# Patient Record
Sex: Male | Born: 1965 | Race: White | Hispanic: No | Marital: Married | Smoking: Never smoker
Health system: Southern US, Community
[De-identification: ages and names within clinical notes are randomized; demographics above are authoritative.]

## PROBLEM LIST (undated history)

## (undated) DIAGNOSIS — E7800 Pure hypercholesterolemia, unspecified: Secondary | ICD-10-CM

## (undated) HISTORY — PX: WISDOM TOOTH EXTRACTION: SHX21

## (undated) HISTORY — DX: Pure hypercholesterolemia, unspecified: E78.00

---

## 2017-07-07 DIAGNOSIS — E7800 Pure hypercholesterolemia, unspecified: Secondary | ICD-10-CM | POA: Insufficient documentation

## 2017-07-07 DIAGNOSIS — F439 Reaction to severe stress, unspecified: Secondary | ICD-10-CM | POA: Insufficient documentation

## 2017-07-07 DIAGNOSIS — K219 Gastro-esophageal reflux disease without esophagitis: Secondary | ICD-10-CM | POA: Insufficient documentation

## 2020-10-12 DIAGNOSIS — Z03818 Encounter for observation for suspected exposure to other biological agents ruled out: Secondary | ICD-10-CM | POA: Diagnosis not present

## 2020-10-12 DIAGNOSIS — Z20822 Contact with and (suspected) exposure to covid-19: Secondary | ICD-10-CM | POA: Diagnosis not present

## 2021-06-03 ENCOUNTER — Other Ambulatory Visit: Payer: Self-pay

## 2021-06-03 ENCOUNTER — Other Ambulatory Visit: Payer: Self-pay | Admitting: Family Medicine

## 2021-06-03 ENCOUNTER — Ambulatory Visit (INDEPENDENT_AMBULATORY_CARE_PROVIDER_SITE_OTHER): Payer: No Typology Code available for payment source | Admitting: Family Medicine

## 2021-06-03 ENCOUNTER — Encounter: Payer: Self-pay | Admitting: Family Medicine

## 2021-06-03 DIAGNOSIS — E7800 Pure hypercholesterolemia, unspecified: Secondary | ICD-10-CM | POA: Diagnosis not present

## 2021-06-03 DIAGNOSIS — K219 Gastro-esophageal reflux disease without esophagitis: Secondary | ICD-10-CM | POA: Diagnosis not present

## 2021-06-03 MED ORDER — OMEPRAZOLE 20 MG PO CPDR: 20.0000 mg | DELAYED_RELEASE_CAPSULE | Freq: Every day | ORAL | 3 refills | Status: DC

## 2021-06-03 NOTE — Progress Notes (Signed)
Cody Griffin - 55 y.o. male MRN [id number]  Date of birth: [birth year]  Subjective No chief complaint on file.   HPI Cody Griffin is a 55 year old male here today for initial visit to establish care.  He reports he has been in good health.  He does have a history of hyperlipidemia and was treated with pravastatin at one point time.  He is taking Prilosec daily for management of acid reflux.  He would like a prescription for this.  He will plan to schedule a physical to get caught up on labs as well as health maintenance items.  ROS:  A comprehensive ROS was completed and negative except as noted per HPI  Allergies  Allergen Reactions   Penicillins Hives    Past Medical History:  Diagnosis Date   Hypercholesterolemia     Past Surgical History:  Procedure Laterality Date   WISDOM TOOTH EXTRACTION      Social History   Socioeconomic History   Marital status: Married    Spouse name: Not on file   Number of children: Not on file   Years of education: Not on file   Highest education level: Not on file  Occupational History   Not on file  Tobacco Use   Smoking status: Never    Passive exposure: Never   Smokeless tobacco: Never  Vaping Use   Vaping Use: Not on file  Substance and Sexual Activity   Alcohol use: Never   Drug use: Never   Sexual activity: Yes    Partners: Female  Other Topics Concern   Not on file  Social History Narrative   Not on file   Social Determinants of Health   Financial Resource Strain: Not on file  Food Insecurity: Not on file  Transportation Needs: Not on file  Physical Activity: Not on file  Stress: Not on file  Social Connections: Not on file    Family History  Problem Relation Age of Onset   Diabetes Mother    Melanoma Father     Health Maintenance  Topic Date Due   COVID-19 Vaccine (1) Never done   HIV Screening  Never done   Hepatitis C Screening  Never done   COLONOSCOPY (Pts 45-4yrs Insurance coverage will need to be  confirmed)  Never done   Zoster Vaccines- Shingrix (1 of 2) Never done   INFLUENZA VACCINE  05/04/2021   TETANUS/TDAP  07/08/2027   Pneumococcal Vaccine 80-20 Years old  Aged Out   HPV VACCINES  Aged Out     ----------------------------------------------------------------------------------------------------------------------------------------------------------------------------------------------------------------- Physical Exam BP 129/84   Pulse 73   Temp 97.9 F (36.6 C)   Ht 6' 1.62" (1.87 m)   Wt (!) 305 lb 6.4 oz (138.5 kg)   SpO2 95%   BMI 39.61 kg/m   Physical Exam Constitutional:      Appearance: Normal appearance.  HENT:     Head: Normocephalic and atraumatic.  Eyes:     General: No scleral icterus. Musculoskeletal:     Cervical back: Neck supple.  Neurological:     General: No focal deficit present.     Mental Status: He is alert.  Psychiatric:        Mood and Affect: Mood normal.        Behavior: Behavior normal.    ------------------------------------------------------------------------------------------------------------------------------------------------------------------------------------------------------------------- Assessment and Plan  Gastroesophageal reflux disease without esophagitis Prescription for Prilosec renewed.  This is working well for him at current strength.  Hypercholesteremia We will plan to update lipid panel at upcoming  annual exam.   Meds ordered this encounter  Medications   omeprazole (PRILOSEC) 20 MG capsule    Sig: Take 1 capsule (20 mg total) by mouth daily.    Dispense:  30 capsule    Refill:  3    No follow-ups on file.    This visit occurred during the SARS-CoV-2 public health emergency.  Safety protocols were in place, including screening questions prior to the visit, additional usage of staff PPE, and extensive cleaning of exam room while observing appropriate contact time as indicated for disinfecting  solutions.

## 2021-06-03 NOTE — Patient Instructions (Signed)
Nice to meet you today! Schedule appointment for annual exam.  We'll get updated labs at that visit.

## 2021-06-03 NOTE — Assessment & Plan Note (Signed)
We will plan to update lipid panel at upcoming annual exam.

## 2021-06-03 NOTE — Assessment & Plan Note (Signed)
Prescription for Prilosec renewed.  This is working well for him at current strength.

## 2021-07-07 ENCOUNTER — Encounter: Payer: Self-pay | Admitting: Family Medicine

## 2021-07-07 ENCOUNTER — Other Ambulatory Visit: Payer: Self-pay

## 2021-07-07 ENCOUNTER — Ambulatory Visit (INDEPENDENT_AMBULATORY_CARE_PROVIDER_SITE_OTHER): Payer: No Typology Code available for payment source | Admitting: Family Medicine

## 2021-07-07 VITALS — BP 149/81 | HR 70 | Temp 97.6°F | Ht 73.0 in | Wt 306.0 lb

## 2021-07-07 DIAGNOSIS — Z125 Encounter for screening for malignant neoplasm of prostate: Secondary | ICD-10-CM

## 2021-07-07 DIAGNOSIS — Z23 Encounter for immunization: Secondary | ICD-10-CM | POA: Diagnosis not present

## 2021-07-07 DIAGNOSIS — Z0000 Encounter for general adult medical examination without abnormal findings: Secondary | ICD-10-CM

## 2021-07-07 DIAGNOSIS — Z1211 Encounter for screening for malignant neoplasm of colon: Secondary | ICD-10-CM

## 2021-07-07 DIAGNOSIS — E7800 Pure hypercholesterolemia, unspecified: Secondary | ICD-10-CM

## 2021-07-07 DIAGNOSIS — Z1159 Encounter for screening for other viral diseases: Secondary | ICD-10-CM

## 2021-07-07 DIAGNOSIS — L309 Dermatitis, unspecified: Secondary | ICD-10-CM

## 2021-07-07 DIAGNOSIS — R7309 Other abnormal glucose: Secondary | ICD-10-CM | POA: Diagnosis not present

## 2021-07-07 MED ORDER — CLOBETASOL PROPIONATE 0.05 % EX CREA: 1.0000 "application " | TOPICAL_CREAM | Freq: Two times a day (BID) | CUTANEOUS | 1 refills | Status: DC

## 2021-07-07 NOTE — Assessment & Plan Note (Signed)
Start clobetasol cream twice daily as needed.

## 2021-07-07 NOTE — Assessment & Plan Note (Signed)
Well adult Orders Placed This Encounter  Procedures  . Varicella-zoster vaccine IM (Shingrix)  . Flu Vaccine QUAD 6+ mos PF IM (Fluarix Quad PF)  . Cologuard  . COMPLETE METABOLIC PANEL WITH GFR  . CBC with Differential  . Lipid Panel w/reflex Direct LDL  . PSA  . Hepatitis C Antibody  Screenings: Hep C antibody, Cologuard and lipid panel. Immunizations: Shingrix and flu vaccine given today. Anticipatory guidance/risk factor reduction: I encouraged him to continue to try to stay active and work on dietary change for weight management.  Additional recommendations per AVS.

## 2021-07-07 NOTE — Progress Notes (Signed)
Cody Griffin - 55 y.o. male MRN [id number]  Date of birth: 27-Aug-1966  Subjective No chief complaint on file.   HPI Cody Griffin is a 55 year old male here today for annual exam.  He states overall he is doing well.  He does have a rash on bilateral palms.  This is recurrent and he does have some cracking and scaling of skin.  He does use a moisturizer on his hands daily.  He would like to have shingles and flu vaccine today. He reports that he tries to stay fairly active and walks frequently.  Is felt his diet could be improved.  He does tend to eat out a bit more due to his kids traveling sports teams.  He is a non-smoker.  He denies alcohol use.  He has never had colon cancer screening and would prefer to have Cologuard.  Review of Systems  Constitutional:  Negative for chills, fever, malaise/fatigue and weight loss.  HENT:  Negative for congestion, ear pain and sore throat.   Eyes:  Negative for blurred vision, double vision and pain.  Respiratory:  Negative for cough and shortness of breath.   Cardiovascular:  Negative for chest pain and palpitations.  Gastrointestinal:  Negative for abdominal pain, blood in stool, constipation, heartburn and nausea.  Genitourinary:  Negative for dysuria and urgency.  Musculoskeletal:  Negative for joint pain and myalgias.  Neurological:  Negative for dizziness and headaches.  Endo/Heme/Allergies:  Does not bruise/bleed easily.  Psychiatric/Behavioral:  Negative for depression. The patient is not nervous/anxious and does not have insomnia.      Allergies  Allergen Reactions   Penicillins Hives    Past Medical History:  Diagnosis Date   Hypercholesterolemia     Past Surgical History:  Procedure Laterality Date   WISDOM TOOTH EXTRACTION      Social History   Socioeconomic History   Marital status: Married    Spouse name: Not on file   Number of children: Not on file   Years of education: Not on file   Highest education level: Not  on file  Occupational History   Not on file  Tobacco Use   Smoking status: Never    Passive exposure: Never   Smokeless tobacco: Never  Vaping Use   Vaping Use: Not on file  Substance and Sexual Activity   Alcohol use: Never   Drug use: Never   Sexual activity: Yes    Partners: Female  Other Topics Concern   Not on file  Social History Narrative   Not on file   Social Determinants of Health   Financial Resource Strain: Not on file  Food Insecurity: Not on file  Transportation Needs: Not on file  Physical Activity: Not on file  Stress: Not on file  Social Connections: Not on file    Family History  Problem Relation Age of Onset   Diabetes Mother    Melanoma Father     Health Maintenance  Topic Date Due   COVID-19 Vaccine (1) Never done   HIV Screening  Never done   Hepatitis C Screening  Never done   COLONOSCOPY (Pts 45-4yrs Insurance coverage will need to be confirmed)  Never done   Zoster Vaccines- Shingrix (2 of 2) 09/01/2021   TETANUS/TDAP  07/08/2027   INFLUENZA VACCINE  Completed   HPV VACCINES  Aged Out     ----------------------------------------------------------------------------------------------------------------------------------------------------------------------------------------------------------------- Physical Exam BP (!) 149/81 (BP Location: Left Arm, Patient Position: Sitting, Cuff Size: Large)   Pulse 70   Temp  97.6 F (36.4 C)   Ht 6\' 1"  (1.854 m)   Wt (!) 306 lb (138.8 kg)   SpO2 97%   BMI 40.37 kg/m   Physical Exam Constitutional:      General: He is not in acute distress. HENT:     Head: Normocephalic and atraumatic.     Right Ear: Tympanic membrane and external ear normal.     Left Ear: Tympanic membrane and external ear normal.  Eyes:     General: No scleral icterus. Neck:     Thyroid: No thyromegaly.  Cardiovascular:     Rate and Rhythm: Normal rate and regular rhythm.     Heart sounds: Normal heart sounds.   Pulmonary:     Effort: Pulmonary effort is normal.     Breath sounds: Normal breath sounds.  Abdominal:     General: Bowel sounds are normal. There is no distension.     Palpations: Abdomen is soft.     Tenderness: There is no abdominal tenderness. There is no guarding.  Musculoskeletal:     Cervical back: Normal range of motion.  Lymphadenopathy:     Cervical: No cervical adenopathy.  Skin:    General: Skin is warm and dry.     Findings: No rash.  Neurological:     Mental Status: He is alert and oriented to person, place, and time.     Cranial Nerves: No cranial nerve deficit.     Motor: No abnormal muscle tone.  Psychiatric:        Mood and Affect: Mood normal.        Behavior: Behavior normal.    ------------------------------------------------------------------------------------------------------------------------------------------------------------------------------------------------------------------- Assessment and Plan  Well adult exam Well adult Orders Placed This Encounter  Procedures   Varicella-zoster vaccine IM (Shingrix)   Flu Vaccine QUAD 6+ mos PF IM (Fluarix Quad PF)   Cologuard   COMPLETE METABOLIC PANEL WITH GFR   CBC with Differential   Lipid Panel w/reflex Direct LDL   PSA   Hepatitis C Antibody  Screenings: Hep C antibody, Cologuard and lipid panel. Immunizations: Shingrix and flu vaccine given today. Anticipatory guidance/risk factor reduction: I encouraged him to continue to try to stay active and work on dietary change for weight management.  Additional recommendations per AVS.  Eczema Start clobetasol cream twice daily as needed.   Meds ordered this encounter  Medications   clobetasol cream (TEMOVATE) 0.05 %    Sig: Apply 1 application topically 2 (two) times daily.    Dispense:  45 g    Refill:  1    No follow-ups on file.    This visit occurred during the SARS-CoV-2 public health emergency.  Safety protocols were in place,  including screening questions prior to the visit, additional usage of staff PPE, and extensive cleaning of exam room while observing appropriate contact time as indicated for disinfecting solutions.

## 2021-07-07 NOTE — Patient Instructions (Signed)
Preventive Care 40-53 Years Old, Male Preventive care refers to lifestyle choices and visits with your health care provider that can promote health and wellness. This includes: A yearly physical exam. This is also called an annual wellness visit. Regular dental and eye exams. Immunizations. Screening for certain conditions. Healthy lifestyle choices, such as: Eating a healthy diet. Getting regular exercise. Not using drugs or products that contain nicotine and tobacco. Limiting alcohol use. What can I expect for my preventive care visit? Physical exam Your health care provider will check your: Height and weight. These may be used to calculate your BMI (body mass index). BMI is a measurement that tells if you are at a healthy weight. Heart rate and blood pressure. Body temperature. Skin for abnormal spots. Counseling Your health care provider may ask you questions about your: Past medical problems. Family's medical history. Alcohol, tobacco, and drug use. Emotional well-being. Home life and relationship well-being. Sexual activity. Diet, exercise, and sleep habits. Work and work environment. Access to firearms. What immunizations do I need? Vaccines are usually given at various ages, according to a schedule. Your health care provider will recommend vaccines for you based on your age, medical history, and lifestyle or other factors, such as travel or where you work. What tests do I need? Blood tests Lipid and cholesterol levels. These may be checked every 5 years, or more often if you are over 50 years old. Hepatitis C test. Hepatitis B test. Screening Lung cancer screening. You may have this screening every year starting at age 55 if you have a 30-pack-year history of smoking and currently smoke or have quit within the past 15 years. Prostate cancer screening. Recommendations will vary depending on your family history and other risks. Genital exam to check for testicular cancer  or hernias. Colorectal cancer screening. All adults should have this screening starting at age 50 and continuing until age 75. Your health care provider may recommend screening at age 45 if you are at increased risk. You will have tests every 1-10 years, depending on your results and the type of screening test. Diabetes screening. This is done by checking your blood sugar (glucose) after you have not eaten for a while (fasting). You may have this done every 1-3 years. STD (sexually transmitted disease) testing, if you are at risk. Follow these instructions at home: Eating and drinking  Eat a diet that includes fresh fruits and vegetables, whole grains, lean protein, and low-fat dairy products. Take vitamin and mineral supplements as recommended by your health care provider. Do not drink alcohol if your health care provider tells you not to drink. If you drink alcohol: Limit how much you have to 0-2 drinks a day. Be aware of how much alcohol is in your drink. In the [place] one drink equals one 12 oz bottle of beer (355 mL), one 5 oz glass of wine (148 mL), or one 1 oz glass of hard liquor (44 mL). Lifestyle Take daily care of your teeth and gums. Brush your teeth every morning and night with fluoride toothpaste. Floss one time each day. Stay active. Exercise for at least 30 minutes 5 or more days each week. Do not use any products that contain nicotine or tobacco, such as cigarettes, e-cigarettes, and chewing tobacco. If you need help quitting, ask your health care provider. Do not use drugs. If you are sexually active, practice safe sex. Use a condom or other form of protection to prevent STIs (sexually transmitted infections). If told by your   health care provider, take low-dose aspirin daily starting at age 50. Find healthy ways to cope with stress, such as: Meditation, yoga, or listening to music. Journaling. Talking to a trusted person. Spending time with friends and  family. Safety Always wear your seat belt while driving or riding in a vehicle. Do not drive: If you have been drinking alcohol. Do not ride with someone who has been drinking. When you are tired or distracted. While texting. Wear a helmet and other protective equipment during sports activities. If you have firearms in your house, make sure you follow all gun safety procedures. What's next? Go to your health care provider once a year for an annual wellness visit. Ask your health care provider how often you should have your eyes and teeth checked. Stay up to date on all vaccines. This information is not intended to replace advice given to you by your health care provider. Make sure you discuss any questions you have with your health care provider. Document Revised: 11/28/2020 Document Reviewed: 09/14/2018 Elsevier Patient Education  2022 Elsevier Inc.   

## 2021-07-10 LAB — CBC WITH DIFFERENTIAL/PLATELET
Absolute Monocytes: 519 cells/uL (ref 200–950)
Basophils Absolute: 59 cells/uL (ref 0–200)
Basophils Relative: 1 %
Eosinophils Absolute: 118 cells/uL (ref 15–500)
Eosinophils Relative: 2 %
HCT: 48.3 % (ref 38.5–50.0)
Hemoglobin: 16.6 g/dL (ref 13.2–17.1)
Lymphs Abs: 2631 cells/uL (ref 850–3900)
MCH: 28.7 pg (ref 27.0–33.0)
MCHC: 34.4 g/dL (ref 32.0–36.0)
MCV: 83.4 fL (ref 80.0–100.0)
MPV: 9.5 fL (ref 7.5–12.5)
Monocytes Relative: 8.8 %
Neutro Abs: 2572 cells/uL (ref 1500–7800)
Neutrophils Relative %: 43.6 %
Platelets: 268 10*3/uL (ref 140–400)
RBC: 5.79 10*6/uL (ref 4.20–5.80)
RDW: 13.4 % (ref 11.0–15.0)
Total Lymphocyte: 44.6 %
WBC: 5.9 10*3/uL (ref 3.8–10.8)

## 2021-07-10 LAB — COMPLETE METABOLIC PANEL WITH GFR
AG Ratio: 1.6 (calc) (ref 1.0–2.5)
ALT: 21 U/L (ref 9–46)
AST: 21 U/L (ref 10–35)
Albumin: 4.4 g/dL (ref 3.6–5.1)
Alkaline phosphatase (APISO): 57 U/L (ref 35–144)
BUN: 14 mg/dL (ref 7–25)
CO2: 24 mmol/L (ref 20–32)
Calcium: 9.2 mg/dL (ref 8.6–10.3)
Chloride: 101 mmol/L (ref 98–110)
Creat: 0.94 mg/dL (ref 0.70–1.30)
Globulin: 2.8 g/dL (calc) (ref 1.9–3.7)
Glucose, Bld: 170 mg/dL — ABNORMAL HIGH (ref 65–99)
Potassium: 4.6 mmol/L (ref 3.5–5.3)
Sodium: 136 mmol/L (ref 135–146)
Total Bilirubin: 0.5 mg/dL (ref 0.2–1.2)
Total Protein: 7.2 g/dL (ref 6.1–8.1)
eGFR: 98 mL/min/{1.73_m2} (ref >=60)

## 2021-07-10 LAB — LIPID PANEL W/REFLEX DIRECT LDL
Cholesterol: 204 mg/dL — ABNORMAL HIGH (ref <200)
HDL: 42 mg/dL (ref >=40)
LDL Cholesterol (Calc): 127 mg/dL (calc) — ABNORMAL HIGH
Non-HDL Cholesterol (Calc): 162 mg/dL (calc) — ABNORMAL HIGH (ref <130)
Total CHOL/HDL Ratio: 4.9 (calc) (ref <5.0)
Triglycerides: 211 mg/dL — ABNORMAL HIGH (ref <150)

## 2021-07-10 LAB — HEMOGLOBIN A1C W/OUT EAG: Hgb A1c MFr Bld: 8.4 % of total Hgb — ABNORMAL HIGH (ref <5.7)

## 2021-07-10 LAB — PSA: PSA: 0.14 ng/mL (ref <=4.00)

## 2021-07-10 LAB — HEPATITIS C ANTIBODY
Hepatitis C Ab: NONREACTIVE
SIGNAL TO CUT-OFF: 0.01 (ref <1.00)

## 2021-07-31 DIAGNOSIS — Z1211 Encounter for screening for malignant neoplasm of colon: Secondary | ICD-10-CM | POA: Diagnosis not present

## 2021-08-07 LAB — COLOGUARD: COLOGUARD: NEGATIVE

## 2021-08-11 ENCOUNTER — Other Ambulatory Visit: Payer: Self-pay

## 2021-08-11 ENCOUNTER — Ambulatory Visit (INDEPENDENT_AMBULATORY_CARE_PROVIDER_SITE_OTHER): Payer: No Typology Code available for payment source

## 2021-08-11 ENCOUNTER — Ambulatory Visit: Payer: No Typology Code available for payment source | Admitting: Sports Medicine

## 2021-08-11 DIAGNOSIS — M5030 Other cervical disc degeneration, unspecified cervical region: Secondary | ICD-10-CM | POA: Insufficient documentation

## 2021-08-11 DIAGNOSIS — M549 Dorsalgia, unspecified: Secondary | ICD-10-CM

## 2021-08-11 MED ORDER — CYCLOBENZAPRINE HCL 10 MG PO TABS: ORAL_TABLET | ORAL | 0 refills | Status: DC

## 2021-08-11 MED ORDER — PREDNISONE 50 MG PO TABS: ORAL_TABLET | ORAL | 0 refills | Status: DC
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
## 2021-08-26 ENCOUNTER — Other Ambulatory Visit: Payer: Self-pay

## 2021-08-26 ENCOUNTER — Ambulatory Visit: Payer: No Typology Code available for payment source | Admitting: Sports Medicine

## 2021-08-26 DIAGNOSIS — M549 Dorsalgia, unspecified: Secondary | ICD-10-CM | POA: Diagnosis not present

## 2021-08-26 MED ORDER — METHOCARBAMOL 750 MG PO TABS: 750.0000 mg | ORAL_TABLET | Freq: Every day | ORAL | 1 refills | Status: DC

## 2021-08-26 MED ORDER — PREDNISONE 10 MG (48) PO TBPK: ORAL_TABLET | Freq: Every day | ORAL | 0 refills | Status: DC
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
## 2021-09-08 ENCOUNTER — Ambulatory Visit: Payer: No Typology Code available for payment source

## 2021-09-17 ENCOUNTER — Ambulatory Visit: Payer: No Typology Code available for payment source | Admitting: Sports Medicine

## 2021-09-17 ENCOUNTER — Other Ambulatory Visit: Payer: Self-pay

## 2021-09-17 DIAGNOSIS — M5030 Other cervical disc degeneration, unspecified cervical region: Secondary | ICD-10-CM | POA: Diagnosis not present

## 2021-09-17 DIAGNOSIS — M5136 Other intervertebral disc degeneration, lumbar region: Secondary | ICD-10-CM

## 2021-09-17 DIAGNOSIS — Z23 Encounter for immunization: Secondary | ICD-10-CM

## 2021-09-17 DIAGNOSIS — M51369 Other intervertebral disc degeneration, lumbar region without mention of lumbar back pain or lower extremity pain: Secondary | ICD-10-CM | POA: Insufficient documentation

## 2021-09-17 MED ORDER — GABAPENTIN 300 MG PO CAPS: ORAL_CAPSULE | ORAL | 3 refills | Status: DC
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
## 2021-09-17 NOTE — Assessment & Plan Note (Signed)
This is a pleasant 55 year old male, he has axial neck pain with radiation into the periscapular region, and weakness in both arms. X-rays did show multilevel degenerative changes, he has been working with a [facility], we have done several courses of steroids, greater than 6 weeks of home physical therapy. Because he continues to have symptoms we are going to proceed now with MRI of the cervical spine, of note he is having progressive weakness in both upper extremities. This will be for epidural planning, return to see me to go over MRI results, significant pain at night as well and will necessitate gabapentin.

## 2021-10-05 ENCOUNTER — Ambulatory Visit (INDEPENDENT_AMBULATORY_CARE_PROVIDER_SITE_OTHER): Payer: No Typology Code available for payment source

## 2021-10-05 ENCOUNTER — Other Ambulatory Visit: Payer: Self-pay

## 2021-10-05 DIAGNOSIS — M5030 Other cervical disc degeneration, unspecified cervical region: Secondary | ICD-10-CM

## 2021-10-05 DIAGNOSIS — M542 Cervicalgia: Secondary | ICD-10-CM | POA: Diagnosis not present

## 2021-10-05 DIAGNOSIS — M5136 Other intervertebral disc degeneration, lumbar region: Secondary | ICD-10-CM | POA: Diagnosis not present

## 2021-10-05 DIAGNOSIS — M51369 Other intervertebral disc degeneration, lumbar region without mention of lumbar back pain or lower extremity pain: Secondary | ICD-10-CM

## 2022-03-08 ENCOUNTER — Telehealth: Payer: Self-pay | Admitting: General Practice

## 2022-03-08 NOTE — Telephone Encounter (Signed)
Transition Care Management Unsuccessful Follow-up Telephone Call  Date of discharge and from where:  03/06/22 from [facility]  Attempts:  1st Attempt  Reason for unsuccessful TCM follow-up call:  Left voice message

## 2022-03-09 DIAGNOSIS — J342 Deviated nasal septum: Secondary | ICD-10-CM | POA: Insufficient documentation

## 2022-03-09 DIAGNOSIS — J328 Other chronic sinusitis: Secondary | ICD-10-CM | POA: Insufficient documentation

## 2022-03-09 DIAGNOSIS — J343 Hypertrophy of nasal turbinates: Secondary | ICD-10-CM | POA: Insufficient documentation

## 2022-03-09 NOTE — Telephone Encounter (Signed)
Transition Care Management Unsuccessful Follow-up Telephone Call  Date of discharge and from where:  03/06/22 from [facility]  Attempts:  2nd Attempt  Reason for unsuccessful TCM follow-up call:  Left voice message

## 2022-03-10 NOTE — Telephone Encounter (Signed)
Transition Care Management Unsuccessful Follow-up Telephone Call  Date of discharge and from where:  03/06/22 from [facility]  Attempts:  3rd Attempt  Reason for unsuccessful TCM follow-up call:  Left voice message

## 2022-03-16 ENCOUNTER — Ambulatory Visit (INDEPENDENT_AMBULATORY_CARE_PROVIDER_SITE_OTHER): Payer: No Typology Code available for payment source | Admitting: Family Medicine

## 2022-03-16 ENCOUNTER — Encounter: Payer: Self-pay | Admitting: Family Medicine

## 2022-03-16 DIAGNOSIS — J328 Other chronic sinusitis: Secondary | ICD-10-CM | POA: Diagnosis not present

## 2022-03-16 DIAGNOSIS — I10 Essential (primary) hypertension: Secondary | ICD-10-CM

## 2022-03-16 DIAGNOSIS — E119 Type 2 diabetes mellitus without complications: Secondary | ICD-10-CM

## 2022-03-16 DIAGNOSIS — R569 Unspecified convulsions: Secondary | ICD-10-CM | POA: Diagnosis not present

## 2022-03-16 DIAGNOSIS — M5030 Other cervical disc degeneration, unspecified cervical region: Secondary | ICD-10-CM

## 2022-03-16 MED ORDER — BLOOD GLUCOSE METER KIT: PACK | 0 refills | Status: AC

## 2022-03-16 MED ORDER — METFORMIN HCL 500 MG PO TABS: 500.0000 mg | ORAL_TABLET | Freq: Two times a day (BID) | ORAL | 1 refills | Status: DC

## 2022-03-16 MED ORDER — AMLODIPINE BESYLATE 5 MG PO TABS: 5.0000 mg | ORAL_TABLET | Freq: Every day | ORAL | 1 refills | Status: DC

## 2022-03-16 MED ORDER — GABAPENTIN 300 MG PO CAPS: ORAL_CAPSULE | ORAL | 3 refills | Status: DC

## 2022-03-16 MED ORDER — PANTOPRAZOLE SODIUM 40 MG PO TBEC: 40.0000 mg | DELAYED_RELEASE_TABLET | Freq: Every day | ORAL | 1 refills | Status: DC

## 2022-03-16 NOTE — Assessment & Plan Note (Signed)
Started on amlodipine in the hospital.  Blood pressure is well controlled at this time.  Recommend continuation.

## 2022-03-16 NOTE — Progress Notes (Signed)
Cody Griffin - 56 y.o. male MRN [id number]  Date of birth: [birth year]  Subjective Chief Complaint  Patient presents with   Hospitalization Follow-up    HPI Cody Griffin is a 56 year old male here today for hospital follow-up.  He was initially admitted for altered mental status.  Hospitalized from 02/26/2022 to 03/06/2022.  He was concerned that he had a seizure.  Imaging did show possible frontal lobe dural enhancement as well as acute complicated sinusitis.  It appears that the thought process was that proximity and severity of sinusitis to the frontal lobe may have contributed to seizure.  He was treated with IV antibiotics with vancomycin, ceftriaxone and Flagyl.  ENT was consulted and patient was taken for surgical drainage with FESS and left max antrostomy, partial ethmoidectomy, frontal sinusotomy and exploration.  He was discharged on Avelox.  He has had follow-up with ENT.  He does have a follow-up visit with neurology as well.  He has not had any further seizure activity.  Additionally he was found to have newly discovered diabetes with A1c of 9.5%.  He denies any symptoms related to diabetes.  He was started on metformin prior to discharge.  He is tolerating this well so far.  He does not monitor blood sugars at this time.    ROS:  A comprehensive ROS was completed and negative except as noted per HPI   Allergies  Allergen Reactions   Bee Pollen Anaphylaxis   Penicillins Hives   Salmon Oil [Fish Oil] Nausea And Vomiting    Past Medical History:  Diagnosis Date   Hypercholesterolemia     Past Surgical History:  Procedure Laterality Date   WISDOM TOOTH EXTRACTION      Social History   Socioeconomic History   Marital status: Married    Spouse name: Not on file   Number of children: Not on file   Years of education: Not on file   Highest education level: Not on file  Occupational History   Not on file  Tobacco Use   Smoking status: Never    Passive exposure: Never    Smokeless tobacco: Never  Vaping Use   Vaping Use: Not on file  Substance and Sexual Activity   Alcohol use: Never   Drug use: Never   Sexual activity: Yes    Partners: Female  Other Topics Concern   Not on file  Social History Narrative   Not on file   Social Determinants of Health   Financial Resource Strain: Not on file  Food Insecurity: Not on file  Transportation Needs: Not on file  Physical Activity: Not on file  Stress: Not on file  Social Connections: Not on file    Family History  Problem Relation Age of Onset   Diabetes Mother    Melanoma Father     Health Maintenance  Topic Date Due   COVID-19 Vaccine (1) 04/01/2022 (Originally 03/08/1967)   COLONOSCOPY (Pts 45-49yr Insurance coverage will need to be confirmed)  03/17/2023 (Originally 09/07/2013)   HIV Screening  03/17/2023 (Originally 09/08/1983)   INFLUENZA VACCINE  05/04/2022   TETANUS/TDAP  07/08/2027   Hepatitis C Screening  Completed   Zoster Vaccines- Shingrix  Completed   HPV VACCINES  Aged Out     ----------------------------------------------------------------------------------------------------------------------------------------------------------------------------------------------------------------- Physical Exam BP 132/85 (BP Location: Left Arm, Patient Position: Sitting, Cuff Size: Large)   Pulse 74   Ht _0  (1.854 m)   Wt 279 lb (126.6 kg)   SpO2 96%   BMI 36.81 kg/m  Physical Exam Constitutional:      Appearance: Normal appearance.  Eyes:     General: No scleral icterus. Cardiovascular:     Rate and Rhythm: Normal rate and regular rhythm.  Pulmonary:     Effort: Pulmonary effort is normal.     Breath sounds: Normal breath sounds.  Musculoskeletal:     Cervical back: Neck supple.  Neurological:     Mental Status: He is alert.  Psychiatric:        Mood and Affect: Mood normal.        Behavior: Behavior normal.      ------------------------------------------------------------------------------------------------------------------------------------------------------------------------------------------------------------------- Assessment and Plan  Type 2 diabetes mellitus (HCC) Recent A1c 9.5%.  He is already started to make some dietary changes.  Currently taking metformin and tolerating this well.  I did write a prescription for glucometer for him to monitor his medications at home.  He declines referral to dietitian at this time.  Other chronic sinusitis Significant sinus disease with associated dural enhancement on recent MRI.  He did undergo surgical drainage with endoscopic sinus surgery during his hospitalization.  Time.  He continues to see ENT.  Seizure-like activity (HCC) Thought to be related to adjacent sinus disease as there was some frontal dural enhancement on his MRI.  He was not discharged on any antiseizure medication.  He does take gabapentin for cervical radiculopathy.  He does have upcoming visit with neurology as well as repeat MRI.  Essential hypertension Started on amlodipine in the hospital.  Blood pressure is well controlled at this time.  Recommend continuation.   Meds ordered this encounter  Medications   gabapentin (NEURONTIN) 300 MG capsule    Sig: One tab PO qHS for a week, then BID for a week, then TID. May double weekly to a max of 3,671m/day    Dispense:  90 capsule    Refill:  3   metFORMIN (GLUCOPHAGE) 500 MG tablet    Sig: Take 1 tablet (500 mg total) by mouth 2 (two) times daily.    Dispense:  180 tablet    Refill:  1   pantoprazole (PROTONIX) 40 MG tablet    Sig: Take 1 tablet (40 mg total) by mouth daily.    Dispense:  90 tablet    Refill:  1   amLODipine (NORVASC) 5 MG tablet    Sig: Take 1 tablet (5 mg total) by mouth daily.    Dispense:  90 tablet    Refill:  1   blood glucose meter kit and supplies    Sig: Dispense based on patient and  insurance preference. Use to check glucose daily (FOR ICD-10 E10.9, E11.9).    Dispense:  1 each    Refill:  0    Order Specific Question:   Number of strips    Answer:   100    Order Specific Question:   Number of lancets    Answer:   100    Return in about 3 months (around 06/16/2022) for T2DM/HTN.    This visit occurred during the SARS-CoV-2 public health emergency.  Safety protocols were in place, including screening questions prior to the visit, additional usage of staff PPE, and extensive cleaning of exam room while observing appropriate contact time as indicated for disinfecting solutions.

## 2022-03-16 NOTE — Patient Instructions (Signed)

## 2022-03-16 NOTE — Assessment & Plan Note (Signed)
Recent A1c 9.5%.  He is already started to make some dietary changes.  Currently taking metformin and tolerating this well.  I did write a prescription for glucometer for him to monitor his medications at home.  He declines referral to dietitian at this time.

## 2022-03-16 NOTE — Assessment & Plan Note (Signed)
Thought to be related to adjacent sinus disease as there was some frontal dural enhancement on his MRI.  He was not discharged on any antiseizure medication.  He does take gabapentin for cervical radiculopathy.  He does have upcoming visit with neurology as well as repeat MRI.

## 2022-03-16 NOTE — Assessment & Plan Note (Signed)
Significant sinus disease with associated dural enhancement on recent MRI.  He did undergo surgical drainage with endoscopic sinus surgery during his hospitalization.  Time.  He continues to see ENT.

## 2022-05-17 ENCOUNTER — Telehealth: Payer: Self-pay

## 2022-05-17 NOTE — Telephone Encounter (Signed)
Masking and frequent hand washing.

## 2022-05-17 NOTE — Telephone Encounter (Signed)
Pt lvm stating his in-home Mother-in-Law is COVID +. Are there any precautions he should take?

## 2022-05-17 NOTE — Telephone Encounter (Signed)
Pt has been advised of recommendations.

## 2022-06-16 ENCOUNTER — Ambulatory Visit: Payer: No Typology Code available for payment source | Admitting: Family Medicine

## 2022-06-16 ENCOUNTER — Encounter: Payer: Self-pay | Admitting: Family Medicine

## 2022-06-16 VITALS — BP 124/72 | HR 68 | Ht 73.0 in | Wt 265.0 lb

## 2022-06-16 DIAGNOSIS — I10 Essential (primary) hypertension: Secondary | ICD-10-CM

## 2022-06-16 DIAGNOSIS — E119 Type 2 diabetes mellitus without complications: Secondary | ICD-10-CM

## 2022-06-16 LAB — POCT UA - MICROALBUMIN
Albumin/Creatinine Ratio, Urine, POC: <30
Creatinine, POC: 100 mg/dL
Microalbumin Ur, POC: 10 mg/L

## 2022-06-16 LAB — POCT GLYCOSYLATED HEMOGLOBIN (HGB A1C): HbA1c, POC (controlled diabetic range): 5.9 % (ref 0.0–7.0)

## 2022-06-16 NOTE — Assessment & Plan Note (Signed)
Diabetes has improved significantly.  Continue metformin at current strength.  Continue dietary changes.

## 2022-06-16 NOTE — Progress Notes (Signed)
Cody Griffin - 56 y.o. male MRN [id number]  Date of birth: [birth year]  Subjective Chief Complaint  Patient presents with   Hypertension   Diabetes    HPI Cody Griffin is a 56 year old male here today for follow-up visit.  Reports that overall he is doing well.  He has been working on dietary changes as well as increase exercise to improve his blood sugars.  Weight is down about 14 pounds since last visit.  Continues on metformin and is tolerating well.  Feeling good at this time.  Continues on amlodipine for management of hypertension.  Denies side effects from this.  He has not had chest pain, shortness of breath, palpitations, headaches or vision changes.  ROS:  A comprehensive ROS was completed and negative except as noted per HPI  Allergies  Allergen Reactions   Bee Pollen Anaphylaxis   Penicillins Hives   Salmon Oil [Fish Oil] Nausea And Vomiting    Past Medical History:  Diagnosis Date   Hypercholesterolemia     Past Surgical History:  Procedure Laterality Date   WISDOM TOOTH EXTRACTION      Social History   Socioeconomic History   Marital status: Married    Spouse name: Not on file   Number of children: Not on file   Years of education: Not on file   Highest education level: Not on file  Occupational History   Not on file  Tobacco Use   Smoking status: Never    Passive exposure: Never   Smokeless tobacco: Never  Vaping Use   Vaping Use: Not on file  Substance and Sexual Activity   Alcohol use: Never   Drug use: Never   Sexual activity: Yes    Partners: Female  Other Topics Concern   Not on file  Social History Narrative   Not on file   Social Determinants of Health   Financial Resource Strain: Not on file  Food Insecurity: Not on file  Transportation Needs: Not on file  Physical Activity: Not on file  Stress: Not on file  Social Connections: Not on file    Family History  Problem Relation Age of Onset   Diabetes Mother    Melanoma Father      Health Maintenance  Topic Date Due   Diabetic kidney evaluation - GFR measurement  07/07/2022   OPHTHALMOLOGY EXAM  06/16/2022 (Originally 09/07/1978)   COVID-19 Vaccine (1) 11/04/2022 (Originally 03/08/1969)   INFLUENZA VACCINE  01/02/2023 (Originally 05/04/2022)   COLONOSCOPY (Pts 45-29yrs Insurance coverage will need to be confirmed)  03/17/2023 (Originally 09/07/2013)   HIV Screening  03/17/2023 (Originally 09/08/1983)   HEMOGLOBIN A1C  12/15/2022   Diabetic kidney evaluation - Urine ACR  06/17/2023   FOOT EXAM  06/17/2023   TETANUS/TDAP  07/08/2027   Hepatitis C Screening  Completed   Zoster Vaccines- Shingrix  Completed   HPV VACCINES  Aged Out     ----------------------------------------------------------------------------------------------------------------------------------------------------------------------------------------------------------------- Physical Exam BP 124/72 (BP Location: Left Arm, Patient Position: Sitting, Cuff Size: Large)   Pulse 68   Ht 6\' 1"  (1.854 m)   Wt 265 lb (120.2 kg)   SpO2 97%   BMI 34.96 kg/m   Physical Exam Constitutional:      Appearance: Normal appearance.  Eyes:     General: No scleral icterus. Cardiovascular:     Rate and Rhythm: Normal rate and regular rhythm.  Pulmonary:     Effort: Pulmonary effort is normal.     Breath sounds: Normal breath sounds.  Musculoskeletal:  Cervical back: Neck supple.  Neurological:     Mental Status: He is alert.  Psychiatric:        Mood and Affect: Mood normal.        Behavior: Behavior normal.     ------------------------------------------------------------------------------------------------------------------------------------------------------------------------------------------------------------------- Assessment and Plan  Essential hypertension Blood pressure well controlled at this time.  Recommend continuation of amlodipine at current strength.  Type 2 diabetes mellitus  (HCC) Diabetes has improved significantly.  Continue metformin at current strength.  Continue dietary changes.   No orders of the defined types were placed in this encounter.   Return in about 3 months (around 09/15/2022) for HTN/T2DM.    This visit occurred during the SARS-CoV-2 public health emergency.  Safety protocols were in place, including screening questions prior to the visit, additional usage of staff PPE, and extensive cleaning of exam room while observing appropriate contact time as indicated for disinfecting solutions.

## 2022-06-16 NOTE — Patient Instructions (Signed)
Continue current medications. See me again in 3 months.  

## 2022-06-16 NOTE — Assessment & Plan Note (Signed)
Blood pressure well controlled at this time.  Recommend continuation of amlodipine at current strength. 

## 2022-07-16 DIAGNOSIS — J343 Hypertrophy of nasal turbinates: Secondary | ICD-10-CM | POA: Diagnosis not present

## 2022-07-16 DIAGNOSIS — Z9889 Other specified postprocedural states: Secondary | ICD-10-CM | POA: Diagnosis not present

## 2022-07-16 DIAGNOSIS — J320 Chronic maxillary sinusitis: Secondary | ICD-10-CM | POA: Diagnosis not present

## 2022-07-16 DIAGNOSIS — J342 Deviated nasal septum: Secondary | ICD-10-CM | POA: Diagnosis not present

## 2022-07-16 DIAGNOSIS — J3489 Other specified disorders of nose and nasal sinuses: Secondary | ICD-10-CM | POA: Diagnosis not present

## 2022-07-16 DIAGNOSIS — J328 Other chronic sinusitis: Secondary | ICD-10-CM | POA: Diagnosis not present

## 2022-07-16 DIAGNOSIS — J018 Other acute sinusitis: Secondary | ICD-10-CM | POA: Diagnosis not present

## 2022-09-15 ENCOUNTER — Other Ambulatory Visit: Payer: Self-pay | Admitting: Family Medicine

## 2022-09-15 ENCOUNTER — Encounter: Payer: Self-pay | Admitting: Family Medicine

## 2022-09-15 ENCOUNTER — Ambulatory Visit: Payer: BC Managed Care – PPO | Admitting: Family Medicine

## 2022-09-15 VITALS — BP 125/81 | HR 70 | Ht 73.0 in | Wt 262.0 lb

## 2022-09-15 DIAGNOSIS — E119 Type 2 diabetes mellitus without complications: Secondary | ICD-10-CM

## 2022-09-15 DIAGNOSIS — I10 Essential (primary) hypertension: Secondary | ICD-10-CM

## 2022-09-15 DIAGNOSIS — M5030 Other cervical disc degeneration, unspecified cervical region: Secondary | ICD-10-CM

## 2022-09-15 DIAGNOSIS — Z23 Encounter for immunization: Secondary | ICD-10-CM

## 2022-09-15 LAB — POCT GLYCOSYLATED HEMOGLOBIN (HGB A1C): HbA1c, POC (controlled diabetic range): 6.4 % (ref 0.0–7.0)

## 2022-09-15 MED ORDER — AMLODIPINE BESYLATE 5 MG PO TABS: 5.0000 mg | ORAL_TABLET | Freq: Every day | ORAL | 1 refills | Status: DC

## 2022-09-15 MED ORDER — PANTOPRAZOLE SODIUM 40 MG PO TBEC: 40.0000 mg | DELAYED_RELEASE_TABLET | Freq: Every day | ORAL | 1 refills | Status: DC

## 2022-09-15 MED ORDER — METFORMIN HCL 500 MG PO TABS: 500.0000 mg | ORAL_TABLET | Freq: Two times a day (BID) | ORAL | 1 refills | Status: DC

## 2022-09-15 MED ORDER — GABAPENTIN 300 MG PO CAPS: ORAL_CAPSULE | ORAL | 3 refills | Status: DC

## 2022-09-15 NOTE — Progress Notes (Signed)
Cody Griffin - 56 y.o. male MRN [id number]  Date of birth: 12-28-1965  Subjective Chief Complaint  Patient presents with   Diabetes    HPI Cody Griffin is a 56 year old male here today for follow-up visit.  He reports he is doing well at this time.  Continues on metformin 500 mg twice daily for management of diabetes.  Last A1c indicated blood sugars are well-controlled.  Has not been quite as diligent with diet and activity since last visit.  He denies increased symptoms related to his diabetes.   Blood pressure remains well-controlled with amlodipine 5 mg daily.  Doing well with this at current strength.  Denies symptoms related to hypertension including chest pain, shortness of breath, palpitations, headaches or vision changes.  ROS:  A comprehensive ROS was completed and negative except as noted per HPI  Allergies  Allergen Reactions   Bee Pollen Anaphylaxis   Penicillins Hives   Salmon Oil [Fish Oil] Nausea And Vomiting    Past Medical History:  Diagnosis Date   Hypercholesterolemia     Past Surgical History:  Procedure Laterality Date   WISDOM TOOTH EXTRACTION      Social History   Socioeconomic History   Marital status: Married    Spouse name: Not on file   Number of children: Not on file   Years of education: Not on file   Highest education level: Not on file  Occupational History   Not on file  Tobacco Use   Smoking status: Never    Passive exposure: Never   Smokeless tobacco: Never  Vaping Use   Vaping Use: Not on file  Substance and Sexual Activity   Alcohol use: Never   Drug use: Never   Sexual activity: Yes    Partners: Female  Other Topics Concern   Not on file  Social History Narrative   Not on file   Social Determinants of Health   Financial Resource Strain: Not on file  Food Insecurity: Not on file  Transportation Needs: Not on file  Physical Activity: Not on file  Stress: Not on file  Social Connections: Not on file    Family  History  Problem Relation Age of Onset   Diabetes Mother    Melanoma Father     Health Maintenance  Topic Date Due   Diabetic kidney evaluation - eGFR measurement  07/07/2022   COVID-19 Vaccine (1) 11/04/2022 (Originally 03/08/1969)   COLONOSCOPY (Pts 45-2yr Insurance coverage will need to be confirmed)  03/17/2023 (Originally 09/07/2013)   HIV Screening  03/17/2023 (Originally 09/08/1983)   HEMOGLOBIN A1C  03/17/2023   Diabetic kidney evaluation - Urine ACR  06/17/2023   FOOT EXAM  06/17/2023   OPHTHALMOLOGY EXAM  07/24/2023   DTaP/Tdap/Td (2 - Td or Tdap) 07/08/2027   INFLUENZA VACCINE  Completed   Hepatitis C Screening  Completed   Zoster Vaccines- Shingrix  Completed   HPV VACCINES  Aged Out     ----------------------------------------------------------------------------------------------------------------------------------------------------------------------------------------------------------------- Physical Exam BP 125/81 (BP Location: Left Arm, Patient Position: Sitting, Cuff Size: Large)   Pulse 70   Ht _0  (1.854 m)   Wt 262 lb (118.8 kg)   SpO2 98%   BMI 34.57 kg/m   Physical Exam Constitutional:      Appearance: Normal appearance.  HENT:     Head: Normocephalic and atraumatic.  Eyes:     General: No scleral icterus. Cardiovascular:     Rate and Rhythm: Normal rate and regular rhythm.  Pulmonary:     Effort:  Pulmonary effort is normal.     Breath sounds: Normal breath sounds.  Musculoskeletal:     Cervical back: Neck supple.  Neurological:     Mental Status: He is alert.  Psychiatric:        Mood and Affect: Mood normal.        Behavior: Behavior normal.     ------------------------------------------------------------------------------------------------------------------------------------------------------------------------------------------------------------------- Assessment and Plan  Essential hypertension Blood pressure is well-controlled at  this time.  Recommend continuation of amlodipine at current strength.  Type 2 diabetes mellitus (HCC)  A1c has increased since last visit.  Continue metformin at current strength.  We discussed working on dietary changes to improve blood glucose.  Follow-up in 6 months.   Meds ordered this encounter  Medications   amLODipine (NORVASC) 5 MG tablet    Sig: Take 1 tablet (5 mg total) by mouth daily.    Dispense:  90 tablet    Refill:  1   gabapentin (NEURONTIN) 300 MG capsule    Sig: One tab PO qHS for a week, then BID for a week, then TID. May double weekly to a max of 3,674m/day    Dispense:  90 capsule    Refill:  3   metFORMIN (GLUCOPHAGE) 500 MG tablet    Sig: Take 1 tablet (500 mg total) by mouth 2 (two) times daily.    Dispense:  180 tablet    Refill:  1   pantoprazole (PROTONIX) 40 MG tablet    Sig: Take 1 tablet (40 mg total) by mouth daily.    Dispense:  90 tablet    Refill:  1    Return in about 6 months (around 03/17/2023) for T2DM/HTN/Fasting labs.    This visit occurred during the SARS-CoV-2 public health emergency.  Safety protocols were in place, including screening questions prior to the visit, additional usage of staff PPE, and extensive cleaning of exam room while observing appropriate contact time as indicated for disinfecting solutions.

## 2022-09-15 NOTE — Assessment & Plan Note (Addendum)
  A1c has increased since last visit.  Continue metformin at current strength.  We discussed working on dietary changes to improve blood glucose.  Follow-up in 6 months.

## 2022-09-15 NOTE — Assessment & Plan Note (Signed)
Blood pressure is well-controlled at this time.  Recommend continuation of amlodipine at current strength. 

## 2023-03-16 ENCOUNTER — Other Ambulatory Visit: Payer: Self-pay | Admitting: Family Medicine

## 2023-03-16 DIAGNOSIS — E119 Type 2 diabetes mellitus without complications: Secondary | ICD-10-CM

## 2023-03-16 DIAGNOSIS — I10 Essential (primary) hypertension: Secondary | ICD-10-CM

## 2023-03-16 DIAGNOSIS — K219 Gastro-esophageal reflux disease without esophagitis: Secondary | ICD-10-CM

## 2023-03-16 NOTE — Telephone Encounter (Signed)
Hold medication refill until 03/20/23

## 2023-03-22 ENCOUNTER — Ambulatory Visit: Payer: BC Managed Care – PPO | Admitting: Family Medicine

## 2023-04-13 ENCOUNTER — Encounter: Payer: Self-pay | Admitting: Family Medicine

## 2023-04-13 ENCOUNTER — Ambulatory Visit: Payer: BC Managed Care – PPO | Admitting: Family Medicine

## 2023-04-13 VITALS — BP 117/77 | HR 59 | Ht 73.0 in | Wt 260.0 lb

## 2023-04-13 DIAGNOSIS — I10 Essential (primary) hypertension: Secondary | ICD-10-CM

## 2023-04-13 DIAGNOSIS — E119 Type 2 diabetes mellitus without complications: Secondary | ICD-10-CM | POA: Diagnosis not present

## 2023-04-13 DIAGNOSIS — Z125 Encounter for screening for malignant neoplasm of prostate: Secondary | ICD-10-CM | POA: Diagnosis not present

## 2023-04-13 DIAGNOSIS — E7800 Pure hypercholesterolemia, unspecified: Secondary | ICD-10-CM

## 2023-04-13 NOTE — Progress Notes (Signed)
Cody Griffin - 57 y.o. male MRN [id number]  Date of birth: 27-Jul-1966  Subjective Chief Complaint  Patient presents with   Hypertension   Diabetes    HPI Cody Griffin is a 57 year old male here today for follow-up visit.  Reports he is doing well.  Denies any concerns since last visit.  Blood pressure remains well-controlled with amlodipine.  Tolerating without side effects.  He has not had chest pain, shortness of breath, palpitations, headaches or vision changes.  Continues on metformin for management of his diabetes.  A1c is improved slightly since last visit.  Tolerating metformin well at current strength.  ROS:  A comprehensive ROS was completed and negative except as noted per HPI  Allergies  Allergen Reactions   Bee Pollen Anaphylaxis   Penicillins Hives   Salmon Oil [Fish Oil] Nausea And Vomiting    Past Medical History:  Diagnosis Date   Hypercholesterolemia     Past Surgical History:  Procedure Laterality Date   WISDOM TOOTH EXTRACTION      Social History   Socioeconomic History   Marital status: Married    Spouse name: Not on file   Number of children: Not on file   Years of education: Not on file   Highest education level: Not on file  Occupational History   Not on file  Tobacco Use   Smoking status: Never    Passive exposure: Never   Smokeless tobacco: Never  Vaping Use   Vaping Use: Not on file  Substance and Sexual Activity   Alcohol use: Never   Drug use: Never   Sexual activity: Yes    Partners: Female  Other Topics Concern   Not on file  Social History Narrative   Not on file   Social Determinants of Health   Financial Resource Strain: Not on file  Food Insecurity: Not on file  Transportation Needs: Not on file  Physical Activity: Not on file  Stress: Not on file  Social Connections: Not on file    Family History  Problem Relation Age of Onset   Diabetes Mother    Melanoma Father     Health Maintenance  Topic Date  Due   COVID-19 Vaccine (1) Never done   HIV Screening  Never done   Colonoscopy  Never done   Diabetic kidney evaluation - eGFR measurement  07/07/2022   HEMOGLOBIN A1C  03/17/2023   INFLUENZA VACCINE  05/05/2023   Diabetic kidney evaluation - Urine ACR  06/17/2023   FOOT EXAM  06/17/2023   OPHTHALMOLOGY EXAM  07/24/2023   DTaP/Tdap/Td (2 - Td or Tdap) 07/08/2027   Hepatitis C Screening  Completed   Zoster Vaccines- Shingrix  Completed   HPV VACCINES  Aged Out     ----------------------------------------------------------------------------------------------------------------------------------------------------------------------------------------------------------------- Physical Exam BP 117/77 (BP Location: Left Arm, Patient Position: Sitting, Cuff Size: Large)   Pulse (!) 59   Ht 6\' 1"  (1.854 m)   Wt 260 lb (117.9 kg)   SpO2 98%   BMI 34.30 kg/m   Physical Exam Constitutional:      Appearance: Normal appearance.  HENT:     Head: Normocephalic and atraumatic.  Eyes:     General: No scleral icterus. Cardiovascular:     Rate and Rhythm: Normal rate and regular rhythm.  Musculoskeletal:     Cervical back: Neck supple.  Neurological:     Mental Status: He is alert.  Psychiatric:        Mood and Affect: Mood normal.  Behavior: Behavior normal.     ------------------------------------------------------------------------------------------------------------------------------------------------------------------------------------------------------------------- Assessment and Plan  Essential hypertension Blood pressure mains well-controlled.  He will continue amlodipine at current strength.  Type 2 diabetes mellitus (HCC) Diabetes remains well-controlled.  Continue metformin at current strength.  Regular exercise and low carbohydrate diet recommended.  Hypercholesteremia Updated lipid panel ordered.   No orders of the defined types were placed in this  encounter.   Return in about 6 months (around 10/14/2023) for HTN/T2DM.    This visit occurred during the SARS-CoV-2 public health emergency.  Safety protocols were in place, including screening questions prior to the visit, additional usage of staff PPE, and extensive cleaning of exam room while observing appropriate contact time as indicated for disinfecting solutions.

## 2023-04-13 NOTE — Assessment & Plan Note (Signed)
Diabetes remains well-controlled.  Continue metformin at current strength.  Regular exercise and low carbohydrate diet recommended.

## 2023-04-13 NOTE — Assessment & Plan Note (Signed)
Blood pressure mains well-controlled.  He will continue amlodipine at current strength.

## 2023-04-13 NOTE — Assessment & Plan Note (Signed)
Updated lipid panel ordered. 

## 2023-04-14 DIAGNOSIS — E119 Type 2 diabetes mellitus without complications: Secondary | ICD-10-CM | POA: Diagnosis not present

## 2023-04-14 DIAGNOSIS — I10 Essential (primary) hypertension: Secondary | ICD-10-CM | POA: Diagnosis not present

## 2023-04-14 DIAGNOSIS — E7800 Pure hypercholesterolemia, unspecified: Secondary | ICD-10-CM | POA: Diagnosis not present

## 2023-04-14 DIAGNOSIS — Z125 Encounter for screening for malignant neoplasm of prostate: Secondary | ICD-10-CM | POA: Diagnosis not present

## 2023-04-14 LAB — POCT GLYCOSYLATED HEMOGLOBIN (HGB A1C): HbA1c, POC (controlled diabetic range): 6.3 % (ref 0.0–7.0)

## 2023-04-14 NOTE — Addendum Note (Signed)
Addended by: Ardyth Man on: 04/14/2023 08:50 AM   Modules accepted: Orders

## 2023-04-15 LAB — CBC WITH DIFFERENTIAL/PLATELET
Absolute Monocytes: 542 cells/uL (ref 200–950)
Basophils Absolute: 69 cells/uL (ref 0–200)
Basophils Relative: 1.1 %
Eosinophils Absolute: 120 cells/uL (ref 15–500)
Eosinophils Relative: 1.9 %
HCT: 44.7 % (ref 38.5–50.0)
Hemoglobin: 15.2 g/dL (ref 13.2–17.1)
Lymphs Abs: 2621 cells/uL (ref 850–3900)
MCH: 27.5 pg (ref 27.0–33.0)
MCHC: 34 g/dL (ref 32.0–36.0)
MCV: 80.8 fL (ref 80.0–100.0)
MPV: 9.5 fL (ref 7.5–12.5)
Monocytes Relative: 8.6 %
Neutro Abs: 2948 cells/uL (ref 1500–7800)
Neutrophils Relative %: 46.8 %
Platelets: 243 10*3/uL (ref 140–400)
RBC: 5.53 10*6/uL (ref 4.20–5.80)
RDW: 14.9 % (ref 11.0–15.0)
Total Lymphocyte: 41.6 %
WBC: 6.3 10*3/uL (ref 3.8–10.8)

## 2023-04-15 LAB — COMPLETE METABOLIC PANEL WITH GFR
AG Ratio: 1.6 (calc) (ref 1.0–2.5)
ALT: 11 U/L (ref 9–46)
AST: 14 U/L (ref 10–35)
Albumin: 4.3 g/dL (ref 3.6–5.1)
Alkaline phosphatase (APISO): 66 U/L (ref 35–144)
BUN: 17 mg/dL (ref 7–25)
CO2: 25 mmol/L (ref 20–32)
Calcium: 9.5 mg/dL (ref 8.6–10.3)
Chloride: 104 mmol/L (ref 98–110)
Creat: 1.04 mg/dL (ref 0.70–1.30)
Globulin: 2.7 g/dL (calc) (ref 1.9–3.7)
Glucose, Bld: 114 mg/dL — ABNORMAL HIGH (ref 65–99)
Potassium: 4.2 mmol/L (ref 3.5–5.3)
Sodium: 138 mmol/L (ref 135–146)
Total Bilirubin: 0.5 mg/dL (ref 0.2–1.2)
Total Protein: 7 g/dL (ref 6.1–8.1)
eGFR: 85 mL/min/{1.73_m2} (ref >=60)

## 2023-04-15 LAB — PSA: PSA: 0.19 ng/mL (ref <=4.00)

## 2023-04-15 LAB — LIPID PANEL W/REFLEX DIRECT LDL
Cholesterol: 162 mg/dL (ref <200)
HDL: 44 mg/dL (ref >=40)
LDL Cholesterol (Calc): 98 mg/dL (calc)
Non-HDL Cholesterol (Calc): 118 mg/dL (calc) (ref <130)
Total CHOL/HDL Ratio: 3.7 (calc) (ref <5.0)
Triglycerides: 103 mg/dL (ref <150)

## 2023-04-15 LAB — TSH: TSH: 1.66 mIU/L (ref 0.40–4.50)

## 2023-04-15 LAB — MICROALBUMIN / CREATININE URINE RATIO
Creatinine, Urine: 17 mg/dL — ABNORMAL LOW (ref 20–320)
Microalb Creat Ratio: 18 mg/g creat (ref <30)
Microalb, Ur: 0.3 mg/dL

## 2023-06-16 ENCOUNTER — Other Ambulatory Visit: Payer: Self-pay | Admitting: Family Medicine

## 2023-06-16 DIAGNOSIS — I10 Essential (primary) hypertension: Secondary | ICD-10-CM

## 2023-06-16 DIAGNOSIS — K219 Gastro-esophageal reflux disease without esophagitis: Secondary | ICD-10-CM

## 2023-06-16 DIAGNOSIS — E119 Type 2 diabetes mellitus without complications: Secondary | ICD-10-CM

## 2023-08-29 LAB — HM DIABETES EYE EXAM

## 2023-09-29 ENCOUNTER — Encounter: Payer: Self-pay | Admitting: Family Medicine

## 2023-09-29 ENCOUNTER — Ambulatory Visit: Payer: BC Managed Care – PPO | Admitting: Family Medicine

## 2023-09-29 VITALS — BP 116/78 | HR 90 | Temp 98.1°F | Ht 73.0 in | Wt 285.0 lb

## 2023-09-29 DIAGNOSIS — J069 Acute upper respiratory infection, unspecified: Secondary | ICD-10-CM

## 2023-09-29 DIAGNOSIS — R6889 Other general symptoms and signs: Secondary | ICD-10-CM | POA: Diagnosis not present

## 2023-09-29 LAB — POC COVID19 BINAXNOW: SARS Coronavirus 2 Ag: NEGATIVE

## 2023-09-29 LAB — POCT INFLUENZA A/B
Influenza A, POC: NEGATIVE
Influenza B, POC: NEGATIVE

## 2023-09-29 MED ORDER — PROMETHAZINE-DM 6.25-15 MG/5ML PO SYRP: 5.0000 mL | ORAL_SOLUTION | Freq: Four times a day (QID) | ORAL | 0 refills | Status: DC | PRN

## 2023-09-29 NOTE — Progress Notes (Signed)
Cody Griffin - 57 y.o. male MRN [id number]  Date of birth: December 01, 1965  Subjective Chief Complaint  Patient presents with   Cough   Sinusitis   Headache    HPI Cody Griffin is a 57 y.o. male here today with complaint of congestion, fatigue, body ache, fever and sinus congestion.  Symptoms started yesterday.  He denies dyspnea and wheezing.  Not sleeping well due to not feeling well.  Not using at this time for symptom management.  He is drinking a decent amount of fluids.   ROS:  A comprehensive ROS was completed and negative except as noted per HPI    Past Medical History:  Diagnosis Date   Hypercholesterolemia     Past Surgical History:  Procedure Laterality Date   WISDOM TOOTH EXTRACTION      Social History   Socioeconomic History   Marital status: Married    Spouse name: Not on file   Number of children: Not on file   Years of education: Not on file   Highest education level: Not on file  Occupational History   Not on file  Tobacco Use   Smoking status: Never    Passive exposure: Never   Smokeless tobacco: Never  Vaping Use   Vaping status: Not on file  Substance and Sexual Activity   Alcohol use: Never   Drug use: Never   Sexual activity: Yes    Partners: Female  Other Topics Concern   Not on file  Social History Narrative   Not on file   Social Drivers of Health   Financial Resource Strain: Not on file  Food Insecurity: Not on file  Transportation Needs: Not on file  Physical Activity: Not on file  Stress: Not on file  Social Connections: Unknown (02/15/2022)   Received from [facility], [facility]   Social Network    Social Network: Not on file    Family History  Problem Relation Age of Onset   Diabetes Mother    Melanoma Father     Health Maintenance  Topic Date Due   HIV Screening  Never done   Colonoscopy  Never done   COVID-19 Vaccine (1 - 2024-25 season) Never done   FOOT EXAM  06/17/2023   OPHTHALMOLOGY EXAM   07/24/2023   INFLUENZA VACCINE  01/02/2024 (Originally 05/05/2023)   HEMOGLOBIN A1C  10/14/2023   Diabetic kidney evaluation - eGFR measurement  04/13/2024   Diabetic kidney evaluation - Urine ACR  04/13/2024   DTaP/Tdap/Td (2 - Td or Tdap) 07/08/2027   Hepatitis C Screening  Completed   Zoster Vaccines- Shingrix  Completed   HPV VACCINES  Aged Out     ----------------------------------------------------------------------------------------------------------------------------------------------------------------------------------------------------------------- Physical Exam BP 116/78 (BP Location: Left Arm, Patient Position: Sitting, Cuff Size: Large)   Pulse 90   Temp 98.1 F (36.7 C) (Oral)   Ht 6\' 1"  (1.854 m)   Wt 285 lb (129.3 kg)   SpO2 96%   BMI 37.60 kg/m   Physical Exam Constitutional:      Appearance: Normal appearance.  HENT:     Right Ear: Tympanic membrane normal.     Left Ear: Tympanic membrane normal.     Mouth/Throat:     Mouth: Mucous membranes are moist.  Eyes:     General: No scleral icterus. Cardiovascular:     Rate and Rhythm: Normal rate and regular rhythm.  Pulmonary:     Effort: Pulmonary effort is normal.     Breath sounds: Normal breath sounds.  Neurological:     Mental Status: He is alert.  Psychiatric:        Mood and Affect: Mood normal.        Behavior: Behavior normal.     ------------------------------------------------------------------------------------------------------------------------------------------------------------------------------------------------------------------- Assessment and Plan  URI (upper respiratory infection) Rapid test for COVID and influenza in clinic are negative.  Recommend supportive care with increase fluids and rest.  Will add Promethazine DM cough syrup to use in the evening as needed.  Contact clinic if symptoms worsen or not improving as expected.   Meds ordered this encounter  Medications    promethazine-dextromethorphan (PROMETHAZINE-DM) 6.25-15 MG/5ML syrup    Sig: Take 5 mLs by mouth 4 (four) times daily as needed for cough (Maximum dose: 30mL in 24 hours).    Dispense:  125 mL    Refill:  0    No follow-ups on file.    This visit occurred during the SARS-CoV-2 public health emergency.  Safety protocols were in place, including screening questions prior to the visit, additional usage of staff PPE, and extensive cleaning of exam room while observing appropriate contact time as indicated for disinfecting solutions.

## 2023-09-29 NOTE — Patient Instructions (Signed)
-  Increased fluids - Vitamin C and zinc -Plenty of rest.  -Call if symptoms get worse or are not improving.

## 2023-09-29 NOTE — Assessment & Plan Note (Signed)
Rapid test for COVID and influenza in clinic are negative.  Recommend supportive care with increase fluids and rest.  Will add Promethazine DM cough syrup to use in the evening as needed.  Contact clinic if symptoms worsen or not improving as expected.

## 2023-10-04 IMAGING — MR MR CERVICAL SPINE W/O CM
5 series · 40 of 48 positions shown · non-contrast
Comparison: Radiograph from 08/11/2021.

CLINICAL DATA: Initial evaluation for neck pain, progressive
weakness.

EXAM:
MRI CERVICAL SPINE WITHOUT CONTRAST
TECHNIQUE: Multiplanar, multisequence MR imaging of the cervical spine was
performed. No intravenous contrast was administered.

[Series 3: T2 · sagittal · 3.0mm · 0.86mm/px · 5 of 13 slices shown (1 of 2)]
[im 1/13]
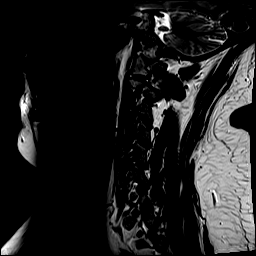
[im 4/13]
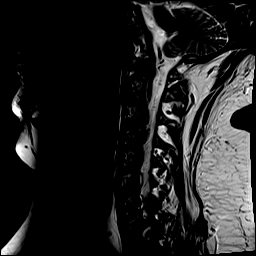
[im 7/13]
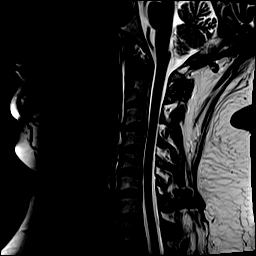
[im 10/13]
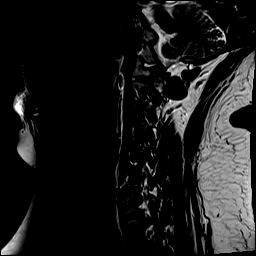
[im 13/13]
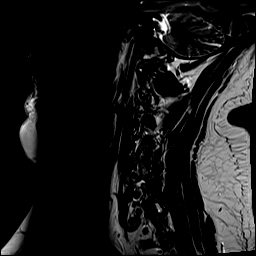

[Series 4: T1 · sagittal · 3.0mm · 0.86mm/px · 5 of 13 slices shown]
[im 1/13]
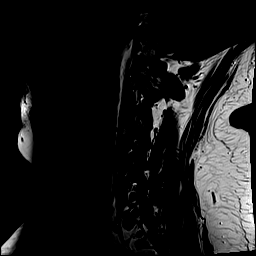
[im 4/13]
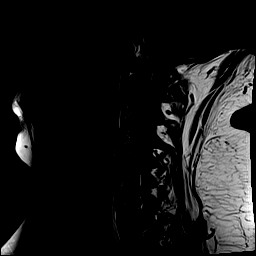
[im 7/13]
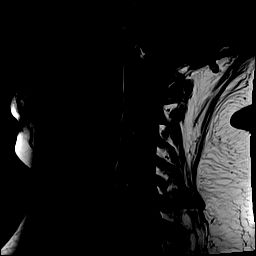
[im 10/13]
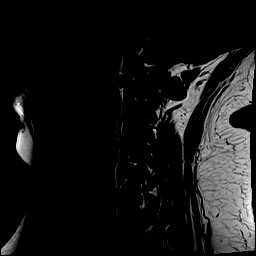
[im 13/13]
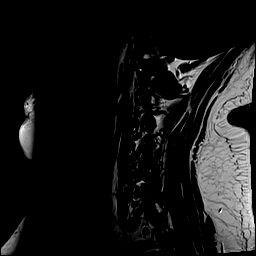

[Series 5: STIR · sagittal · 3.0mm · 0.69mm/px · 6 of 13 slices shown]
[im 1/13]
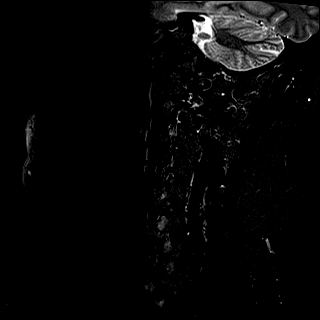
[im 3/13]
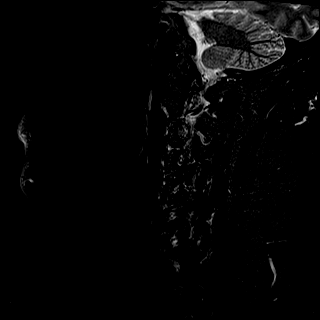
[im 5/13]
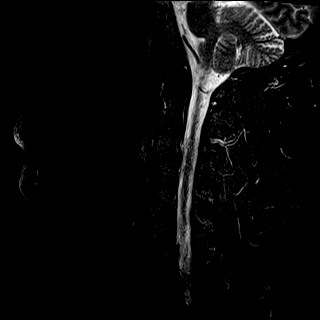
[im 8/13]
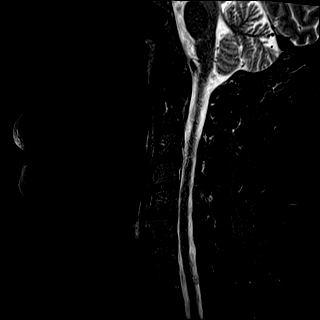
[im 10/13]
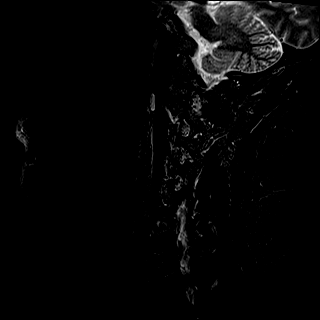
[im 13/13]
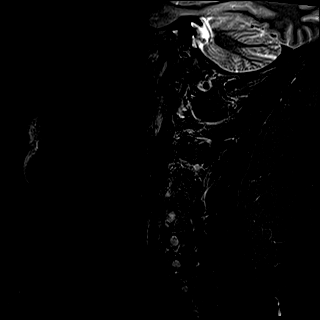

[Series 6: T2 · axial · 3.0mm · 0.70mm/px · z∈[-64,+57]mm · 16 of 36 slices shown (2 of 2)]
[im 1/36]
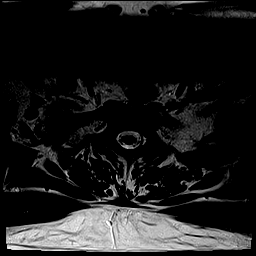
[im 3/36]
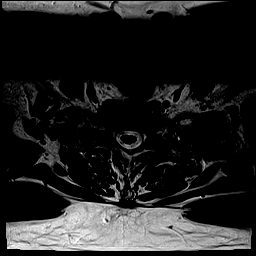
[im 5/36]
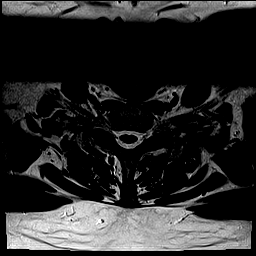
[im 8/36]
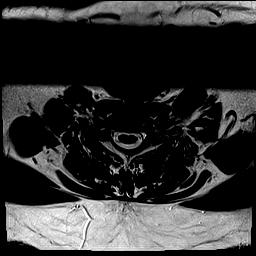
[im 10/36]
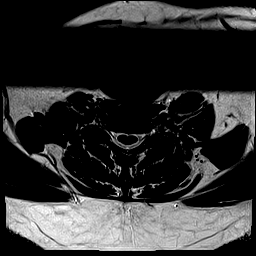
[im 12/36]
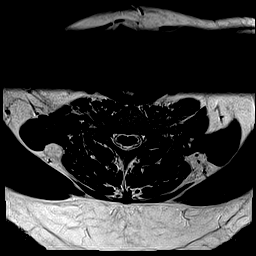
[im 15/36]
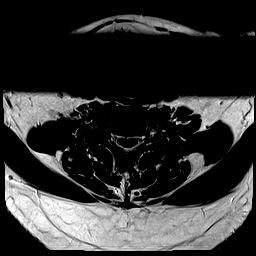
[im 17/36]
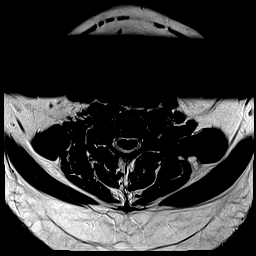
[im 19/36]
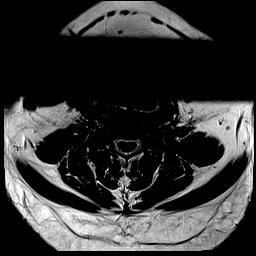
[im 22/36]
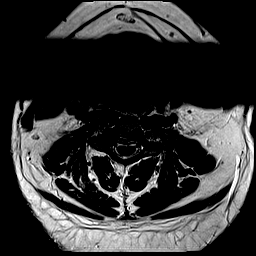
[im 24/36]
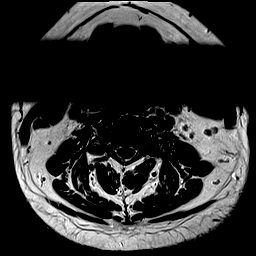
[im 26/36]
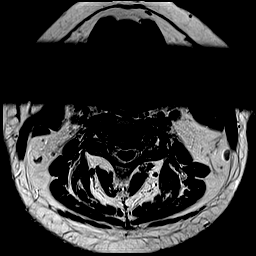
[im 29/36]
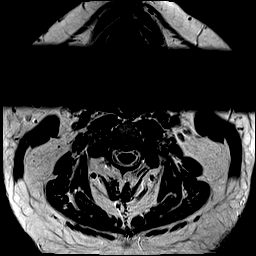
[im 31/36]
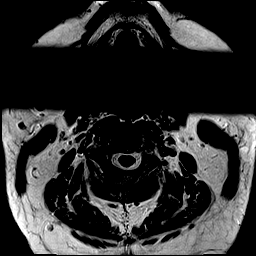
[im 33/36]
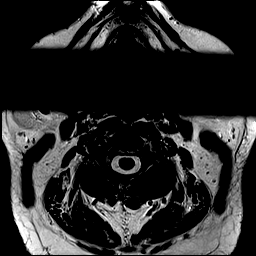
[im 36/36]
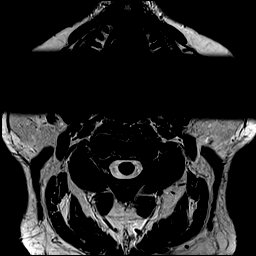

[Series 7: mpgr ax · axial · 3.0mm · 0.35mm/px · z∈[-57,+57]mm · 8 of 36 slices shown]
[im 3/36]
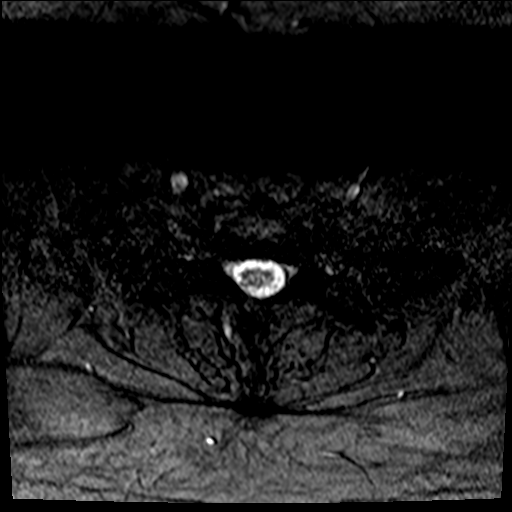
[im 8/36]
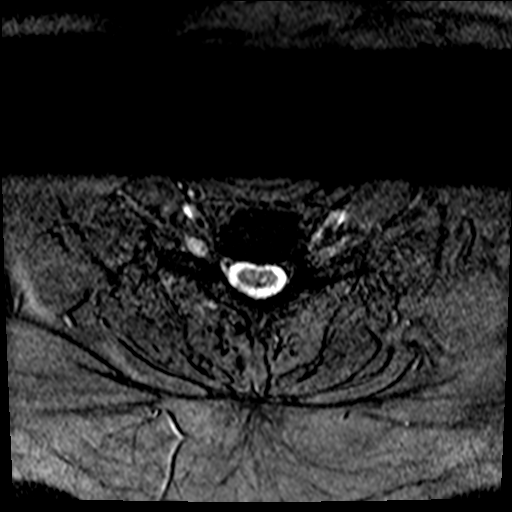
[im 12/36]
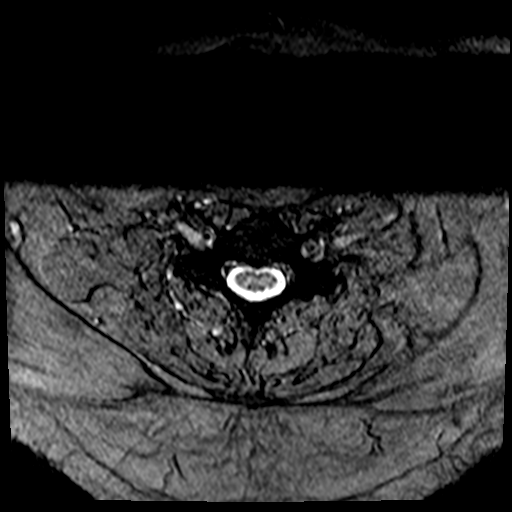
[im 17/36]
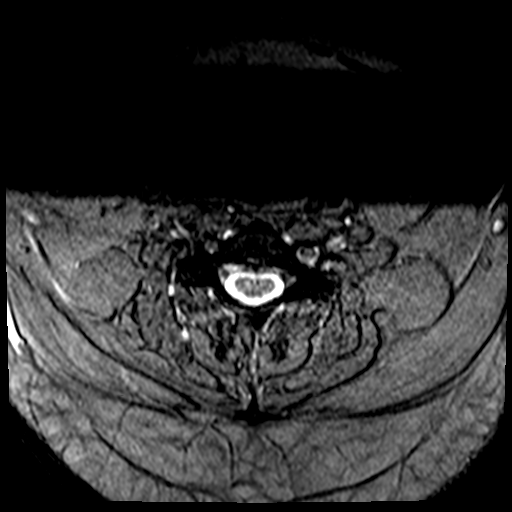
[im 22/36]
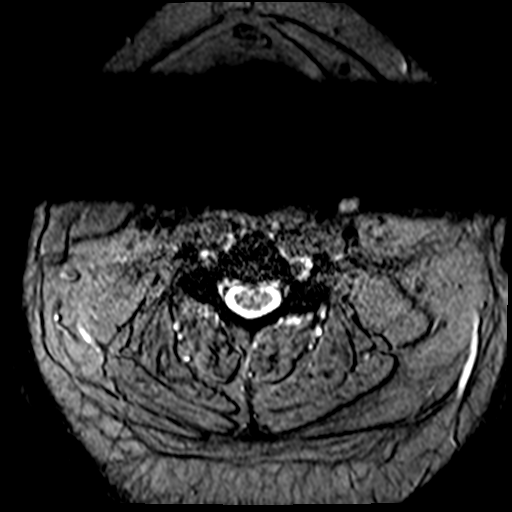
[im 26/36]
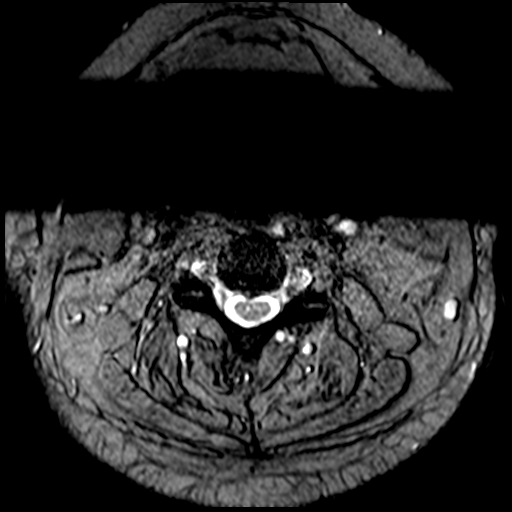
[im 31/36]
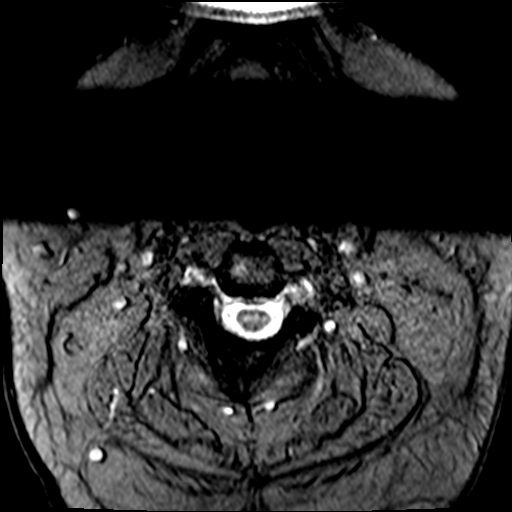
[im 36/36]
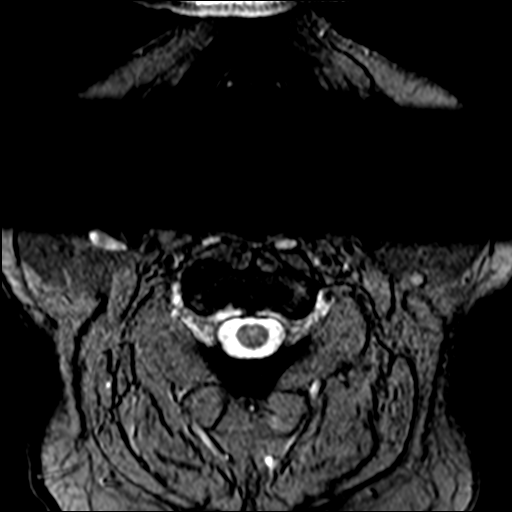

[40 of 48 positions shown; findings below may reference images not displayed]

FINDINGS: Alignment: Straightening of the normal cervical lordosis. No
listhesis.

Vertebrae: Vertebral body height maintained without acute or chronic
fracture. Bone marrow signal intensity within normal limits. No
discrete or worrisome osseous lesions or abnormal marrow edema.

Cord: Normal signal and morphology.

Posterior Fossa, vertebral arteries, paraspinal tissues: Visualized
brain and posterior fossa within normal limits. Craniocervical
junction normal. Paraspinous and prevertebral soft tissues within
normal limits. Normal intravascular flow voids seen within the
vertebral arteries bilaterally.

Disc levels:

C2-C3: Unremarkable.

C3-C4: Mild disc bulge with endplate and uncovertebral spurring.
Superimposed small central disc protrusion indents the ventral
thecal sac (series 6, image 12). Mild spinal stenosis with minimal
flattening of the ventral cord, but no cord signal changes. Mild
right C4 foraminal narrowing. Left neural foramina remains patent.

C4-C5: Minimal disc bulge with uncovertebral hypertrophy. No spinal
stenosis. Mild-to-moderate right C5 foraminal stenosis. Left neural
foramina remains patent.

C5-C6: Mild disc bulge with uncovertebral spurring. Disc bulging
slightly eccentric to the right. Secondary mild flattening of the
ventral thecal sac without significant spinal stenosis or cord
impingement. Foramina remain patent.

C6-C7: Mild disc bulge with uncovertebral hypertrophy. Superimposed
tiny central disc protrusion minimally indents the ventral thecal
sac (series 6, image 28). No significant spinal stenosis or cord
impingement. Foramina remain patent.

C7-T1: Negative interspace. Mild bilateral facet hypertrophy. No
canal or foraminal stenosis.

Visualized upper thoracic spine demonstrates no significant finding.
IMPRESSION: 1. Mild multilevel cervical spondylosis with resultant mild spinal
stenosis at C3-4. No other significant spinal stenosis or frank cord
impingement. No cord signal changes to suggest myelopathy.
2. Mild to moderate right C4 and C5 foraminal narrowing related to
disc bulge and uncovertebral disease.

## 2023-12-13 ENCOUNTER — Other Ambulatory Visit: Payer: Self-pay | Admitting: Family Medicine

## 2023-12-13 DIAGNOSIS — K219 Gastro-esophageal reflux disease without esophagitis: Secondary | ICD-10-CM

## 2023-12-13 DIAGNOSIS — I10 Essential (primary) hypertension: Secondary | ICD-10-CM

## 2023-12-13 DIAGNOSIS — E119 Type 2 diabetes mellitus without complications: Secondary | ICD-10-CM

## 2023-12-13 NOTE — Telephone Encounter (Signed)
 Pls contact the pt to schedule appt with Dr. Ashley Royalty for DM follow-up. Last seen 04/2023. Sending 30 day refill.

## 2023-12-13 NOTE — Telephone Encounter (Signed)
 Called pt to schedule appt, LVM. Thanks!

## 2023-12-29 ENCOUNTER — Ambulatory Visit: Admitting: Family Medicine

## 2023-12-29 ENCOUNTER — Encounter: Payer: Self-pay | Admitting: Family Medicine

## 2023-12-29 VITALS — BP 130/79 | HR 61 | Ht 73.0 in | Wt 264.0 lb

## 2023-12-29 DIAGNOSIS — Z7984 Long term (current) use of oral hypoglycemic drugs: Secondary | ICD-10-CM

## 2023-12-29 DIAGNOSIS — I10 Essential (primary) hypertension: Secondary | ICD-10-CM | POA: Diagnosis not present

## 2023-12-29 DIAGNOSIS — Z23 Encounter for immunization: Secondary | ICD-10-CM | POA: Diagnosis not present

## 2023-12-29 DIAGNOSIS — K219 Gastro-esophageal reflux disease without esophagitis: Secondary | ICD-10-CM | POA: Diagnosis not present

## 2023-12-29 DIAGNOSIS — E119 Type 2 diabetes mellitus without complications: Secondary | ICD-10-CM | POA: Diagnosis not present

## 2023-12-29 DIAGNOSIS — M5030 Other cervical disc degeneration, unspecified cervical region: Secondary | ICD-10-CM | POA: Diagnosis not present

## 2023-12-29 LAB — POCT GLYCOSYLATED HEMOGLOBIN (HGB A1C): HbA1c, POC (controlled diabetic range): 6.5 % (ref 0.0–7.0)

## 2023-12-29 MED ORDER — METFORMIN HCL 500 MG PO TABS: 500.0000 mg | ORAL_TABLET | Freq: Two times a day (BID) | ORAL | 1 refills | Status: DC

## 2023-12-29 MED ORDER — AMLODIPINE BESYLATE 5 MG PO TABS
5.0000 mg | ORAL_TABLET | Freq: Every day | ORAL | 1 refills | Status: DC
Start: 2023-12-29 — End: 2024-07-12

## 2023-12-29 MED ORDER — GABAPENTIN 300 MG PO CAPS
ORAL_CAPSULE | ORAL | 3 refills | Status: DC
Start: 2023-12-29 — End: 2024-06-18

## 2023-12-29 MED ORDER — PANTOPRAZOLE SODIUM 40 MG PO TBEC: 40.0000 mg | DELAYED_RELEASE_TABLET | Freq: Every day | ORAL | 1 refills | Status: DC

## 2023-12-29 NOTE — Assessment & Plan Note (Signed)
Blood pressure mains well-controlled.  He will continue amlodipine at current strength.

## 2023-12-29 NOTE — Progress Notes (Signed)
 Cody Griffin - 58 y.o. male MRN [id number]  Date of birth: Sep 10, 1966  Subjective Chief Complaint  Patient presents with   Diabetes    HPI Cody Griffin is a 58 y.o. male here today for follow up visit.   He reports that he is doing well.  His diabetes is managed with metformin.  A1c today is 6.5%.  He denies side effects at current strength.    BP is managed with amlodipine.  BP today is well controlled.  Denies chest pain, shortness of breath, palpitations, headache or vision changes.  ROS:  A comprehensive ROS was completed and negative except as noted per HPI  Allergies  Allergen Reactions   Bee Pollen Anaphylaxis   Penicillins Hives   Salmon Oil [Fish Oil] Nausea And Vomiting    Past Medical History:  Diagnosis Date   Hypercholesterolemia     Past Surgical History:  Procedure Laterality Date   WISDOM TOOTH EXTRACTION      Social History   Socioeconomic History   Marital status: Married    Spouse name: Not on file   Number of children: Not on file   Years of education: Not on file   Highest education level: Not on file  Occupational History   Not on file  Tobacco Use   Smoking status: Never    Passive exposure: Never   Smokeless tobacco: Never  Vaping Use   Vaping status: Not on file  Substance and Sexual Activity   Alcohol use: Never   Drug use: Never   Sexual activity: Yes    Partners: Female  Other Topics Concern   Not on file  Social History Narrative   Not on file   Social Drivers of Health   Financial Resource Strain: Not on file  Food Insecurity: Not on file  Transportation Needs: Not on file  Physical Activity: Not on file  Stress: Not on file  Social Connections: Unknown (02/15/2022)   Received from [facility], [facility]   Social Network    Social Network: Not on file    Family History  Problem Relation Age of Onset   Diabetes Mother    Melanoma Father     Health Maintenance  Topic Date Due   Pneumococcal Vaccine  55-58 Years old (1 of 2 - PCV) Never done   HIV Screening  Never done   Colonoscopy  Never done   COVID-19 Vaccine (1 - 2024-25 season) Never done   OPHTHALMOLOGY EXAM  07/24/2023   HEMOGLOBIN A1C  10/14/2023   INFLUENZA VACCINE  01/02/2024 (Originally 05/05/2023)   Diabetic kidney evaluation - eGFR measurement  04/13/2024   Diabetic kidney evaluation - Urine ACR  04/13/2024   FOOT EXAM  12/28/2024   DTaP/Tdap/Td (2 - Td or Tdap) 07/08/2027   Hepatitis C Screening  Completed   Zoster Vaccines- Shingrix  Completed   HPV VACCINES  Aged Out     ----------------------------------------------------------------------------------------------------------------------------------------------------------------------------------------------------------------- Physical Exam BP 130/79 (BP Location: Left Arm, Patient Position: Sitting, Cuff Size: Normal)   Pulse 61   Ht 6\' 1"  (1.854 m)   Wt 264 lb (119.7 kg)   SpO2 100%   BMI 34.83 kg/m   Physical Exam Constitutional:      Appearance: Normal appearance.  Eyes:     General: No scleral icterus. Cardiovascular:     Rate and Rhythm: Normal rate and regular rhythm.  Pulmonary:     Effort: Pulmonary effort is normal.     Breath sounds: Normal breath sounds.  Musculoskeletal:  Cervical back: Neck supple.  Neurological:     Mental Status: He is alert.  Psychiatric:        Mood and Affect: Mood normal.        Behavior: Behavior normal.     ------------------------------------------------------------------------------------------------------------------------------------------------------------------------------------------------------------------- Assessment and Plan  Type 2 diabetes mellitus (HCC) Diabetes remains well-controlled.  Continue metformin at current strength.  Regular exercise and low carbohydrate diet recommended.  Essential hypertension Blood pressure mains well-controlled.  He will continue amlodipine at current  strength.   Meds ordered this encounter  Medications   amLODipine (NORVASC) 5 MG tablet    Sig: Take 1 tablet (5 mg total) by mouth daily.    Dispense:  90 tablet    Refill:  1   metFORMIN (GLUCOPHAGE) 500 MG tablet    Sig: Take 1 tablet (500 mg total) by mouth 2 (two) times daily.    Dispense:  180 tablet    Refill:  1   pantoprazole (PROTONIX) 40 MG tablet    Sig: Take 1 tablet (40 mg total) by mouth daily.    Dispense:  90 tablet    Refill:  1   gabapentin (NEURONTIN) 300 MG capsule    Sig: One tab PO qHS for a week, then BID for a week, then TID. May double weekly to a max of 3,600mg /day    Dispense:  90 capsule    Refill:  3    Return in about 6 months (around 06/30/2024) for Hypertension, Type 2 Diabetes.    This visit occurred during the SARS-CoV-2 public health emergency.  Safety protocols were in place, including screening questions prior to the visit, additional usage of staff PPE, and extensive cleaning of exam room while observing appropriate contact time as indicated for disinfecting solutions.

## 2023-12-29 NOTE — Assessment & Plan Note (Signed)
Diabetes remains well-controlled.  Continue metformin at current strength.  Regular exercise and low carbohydrate diet recommended.

## 2023-12-29 NOTE — Addendum Note (Signed)
 Addended by: Ardyth Man on: 12/29/2023 12:01 PM   Modules accepted: Orders

## 2024-06-05 ENCOUNTER — Encounter: Payer: Self-pay | Admitting: Sports Medicine

## 2024-06-16 ENCOUNTER — Other Ambulatory Visit: Payer: Self-pay | Admitting: Family Medicine

## 2024-06-16 DIAGNOSIS — M5030 Other cervical disc degeneration, unspecified cervical region: Secondary | ICD-10-CM

## 2024-06-28 ENCOUNTER — Encounter: Payer: Self-pay | Admitting: Family Medicine

## 2024-06-28 ENCOUNTER — Ambulatory Visit: Admitting: Family Medicine

## 2024-06-28 VITALS — BP 112/71 | HR 63 | Ht 73.0 in | Wt 268.0 lb

## 2024-06-28 DIAGNOSIS — K219 Gastro-esophageal reflux disease without esophagitis: Secondary | ICD-10-CM

## 2024-06-28 DIAGNOSIS — Z125 Encounter for screening for malignant neoplasm of prostate: Secondary | ICD-10-CM

## 2024-06-28 DIAGNOSIS — E7800 Pure hypercholesterolemia, unspecified: Secondary | ICD-10-CM | POA: Diagnosis not present

## 2024-06-28 DIAGNOSIS — I10 Essential (primary) hypertension: Secondary | ICD-10-CM

## 2024-06-28 DIAGNOSIS — Z7984 Long term (current) use of oral hypoglycemic drugs: Secondary | ICD-10-CM

## 2024-06-28 DIAGNOSIS — Z23 Encounter for immunization: Secondary | ICD-10-CM

## 2024-06-28 DIAGNOSIS — E119 Type 2 diabetes mellitus without complications: Secondary | ICD-10-CM

## 2024-06-28 LAB — POCT GLYCOSYLATED HEMOGLOBIN (HGB A1C): HbA1c, POC (controlled diabetic range): 6.4 % (ref 0.0–7.0)

## 2024-06-28 MED ORDER — TIRZEPATIDE 5 MG/0.5ML ~~LOC~~ SOAJ: 5.0000 mg | SUBCUTANEOUS | 1 refills | Status: DC

## 2024-06-28 NOTE — Assessment & Plan Note (Signed)
Updated lipid panel ordered. 

## 2024-06-28 NOTE — Assessment & Plan Note (Signed)
 Diabetes is well controlled at this time.  Discussed change to GLP-1 due to additional benefits compared to metformin .  Will start mounjaro  2.5mg  and titrate to 5mg  after 1 month.  Follow up in 3-4 months.

## 2024-06-28 NOTE — Progress Notes (Signed)
 Cody Griffin - 58 y.o. male MRN [id number]  Date of birth: 13-Nov-1965  Subjective Chief Complaint  Patient presents with   Diabetes   Hypertension    HPI Cody Griffin is a 58 y.o. male here today for follow up visit.    He reports that he is doing well  He is  still taking amlodipine  for management of HTN.  BP is well controlled at current strength.  He denies symptoms related to HTN including chest pain, shortness of breath, palpitations, headache or vision changes.   Remains on metformin  for management of T2DM.  A1c today is 6.4%.  He is tolerating metformin  well at current strength.    He has cervical DDD and is using gabapentin  as needed.  This is working well for him.   ROS:  A comprehensive ROS was completed and negative except as noted per HPI  Allergies  Allergen Reactions   Bee Pollen Anaphylaxis   Penicillins Hives   Salmon Oil [Fish Oil] Nausea And Vomiting    Past Medical History:  Diagnosis Date   Hypercholesterolemia     Past Surgical History:  Procedure Laterality Date   WISDOM TOOTH EXTRACTION      Social History   Socioeconomic History   Marital status: Married    Spouse name: Not on file   Number of children: Not on file   Years of education: Not on file   Highest education level: Not on file  Occupational History   Not on file  Tobacco Use   Smoking status: Never    Passive exposure: Never   Smokeless tobacco: Never  Vaping Use   Vaping status: Not on file  Substance and Sexual Activity   Alcohol use: Never   Drug use: Never   Sexual activity: Yes    Partners: Female  Other Topics Concern   Not on file  Social History Narrative   Not on file   Social Drivers of Health   Financial Resource Strain: Low Risk  (06/28/2024)   Overall Financial Resource Strain (CARDIA)    Difficulty of Paying Living Expenses: Not hard at all  Food Insecurity: No Food Insecurity (06/28/2024)   Hunger Vital Sign    Worried About Running Out of Food  in the Last Year: Never true    Ran Out of Food in the Last Year: Never true  Transportation Needs: No Transportation Needs (06/28/2024)   PRAPARE - Administrator, Civil Service (Medical): No    Lack of Transportation (Non-Medical): No  Physical Activity: Insufficiently Active (06/28/2024)   Exercise Vital Sign    Days of Exercise per Week: 5 days    Minutes of Exercise per Session: 20 min  Stress: No Stress Concern Present (06/28/2024)   [employer] of Occupational Health - Occupational Stress Questionnaire    Feeling of Stress: Only a little  Social Connections: Moderately Integrated (06/28/2024)   Social Connection and Isolation Panel    Frequency of Communication with Friends and Family: More than three times a week    Frequency of Social Gatherings with Friends and Family: Three times a week    Attends Religious Services: 1 to 4 times per year    Active Member of Clubs or Organizations: No    Attends Banker Meetings: Never    Marital Status: Married    Family History  Problem Relation Age of Onset   Diabetes Mother    Melanoma Father     Health Maintenance  Topic Date  Due   Hepatitis B Vaccines 19-59 Average Risk (1 of 3 - 19+ 3-dose series) Never done   Diabetic kidney evaluation - eGFR measurement  04/13/2024   Diabetic kidney evaluation - Urine ACR  04/13/2024   COVID-19 Vaccine (1 - 2024-25 season) Never done   HIV Screening  12/28/2024 (Originally 09/08/1983)   Fecal DNA (Cologuard)  07/31/2024   OPHTHALMOLOGY EXAM  08/28/2024   HEMOGLOBIN A1C  12/26/2024   FOOT EXAM  12/28/2024   DTaP/Tdap/Td (2 - Td or Tdap) 07/08/2027   Pneumococcal Vaccine: 50+ Years  Completed   Influenza Vaccine  Completed   Hepatitis C Screening  Completed   Zoster Vaccines- Shingrix  Completed   HPV VACCINES  Aged Out   Meningococcal B Vaccine  Aged Out      ----------------------------------------------------------------------------------------------------------------------------------------------------------------------------------------------------------------- Physical Exam BP 112/71 (BP Location: Left Arm, Patient Position: Sitting, Cuff Size: Large)   Pulse 63   Ht 6' 1 (1.854 m)   Wt 268 lb (121.6 kg)   SpO2 98%   BMI 35.36 kg/m   Physical Exam Constitutional:      Appearance: Normal appearance.  Cardiovascular:     Rate and Rhythm: Normal rate and regular rhythm.  Pulmonary:     Effort: Pulmonary effort is normal.     Breath sounds: Normal breath sounds.  Neurological:     General: No focal deficit present.     Mental Status: He is alert.  Psychiatric:        Mood and Affect: Mood normal.        Behavior: Behavior normal.     ------------------------------------------------------------------------------------------------------------------------------------------------------------------------------------------------------------------- Assessment and Plan  Type 2 diabetes mellitus (HCC) Diabetes is well controlled at this time.  Discussed change to GLP-1 due to additional benefits compared to metformin .  Will start mounjaro  2.5mg  and titrate to 5mg  after 1 month.  Follow up in 3-4 months.   Hypercholesteremia Updated lipid panel ordered.  Essential hypertension Blood pressure mains well-controlled.  He will continue amlodipine  at current strength.  Gastroesophageal reflux disease without esophagitis Continue prilosec at current strength.    Meds ordered this encounter  Medications   tirzepatide  (MOUNJARO ) 5 MG/0.5ML Pen    Sig: Inject 5 mg into the skin once a week. Start after completion of 2.5mg  sample    Dispense:  6 mL    Refill:  1    Return in about 3 months (around 09/27/2024) for Type 2 Diabetes.

## 2024-06-28 NOTE — Assessment & Plan Note (Signed)
Blood pressure mains well-controlled.  He will continue amlodipine at current strength.

## 2024-06-28 NOTE — Assessment & Plan Note (Signed)
 Continue prilosec at current strength.

## 2024-06-29 LAB — CMP14+EGFR
ALT: 15 IU/L (ref 0–44)
AST: 18 IU/L (ref 0–40)
Albumin: 4.6 g/dL (ref 3.8–4.9)
Alkaline Phosphatase: 83 IU/L (ref 47–123)
BUN/Creatinine Ratio: 13 (ref 9–20)
BUN: 14 mg/dL (ref 6–24)
Bilirubin Total: 0.4 mg/dL (ref 0.0–1.2)
CO2: 21 mmol/L (ref 20–29)
Calcium: 9.6 mg/dL (ref 8.7–10.2)
Chloride: 100 mmol/L (ref 96–106)
Creatinine, Ser: 1.08 mg/dL (ref 0.76–1.27)
Globulin, Total: 2.8 g/dL (ref 1.5–4.5)
Glucose: 109 mg/dL — ABNORMAL HIGH (ref 70–99)
Potassium: 4.5 mmol/L (ref 3.5–5.2)
Sodium: 139 mmol/L (ref 134–144)
Total Protein: 7.4 g/dL (ref 6.0–8.5)
eGFR: 81 mL/min/1.73 (ref >59)

## 2024-06-29 LAB — LIPID PANEL WITH LDL/HDL RATIO
Cholesterol, Total: 167 mg/dL (ref 100–199)
HDL: 40 mg/dL (ref >39)
LDL Chol Calc (NIH): 107 mg/dL — ABNORMAL HIGH (ref 0–99)
LDL/HDL Ratio: 2.7 ratio (ref 0.0–3.6)
Triglycerides: 107 mg/dL (ref 0–149)
VLDL Cholesterol Cal: 20 mg/dL (ref 5–40)

## 2024-06-29 LAB — CBC WITH DIFFERENTIAL/PLATELET
Basophils Absolute: 0.1 x10E3/uL (ref 0.0–0.2)
Basos: 1 %
EOS (ABSOLUTE): 0.2 x10E3/uL (ref 0.0–0.4)
Eos: 2 %
Hematocrit: 41.4 % (ref 37.5–51.0)
Hemoglobin: 12.8 g/dL — ABNORMAL LOW (ref 13.0–17.7)
Immature Grans (Abs): 0 x10E3/uL (ref 0.0–0.1)
Immature Granulocytes: 0 %
Lymphocytes Absolute: 3.2 x10E3/uL — ABNORMAL HIGH (ref 0.7–3.1)
Lymphs: 36 %
MCH: 21.9 pg — ABNORMAL LOW (ref 26.6–33.0)
MCHC: 30.9 g/dL — ABNORMAL LOW (ref 31.5–35.7)
MCV: 71 fL — ABNORMAL LOW (ref 79–97)
Monocytes Absolute: 0.7 x10E3/uL (ref 0.1–0.9)
Monocytes: 8 %
Neutrophils Absolute: 4.6 x10E3/uL (ref 1.4–7.0)
Neutrophils: 53 %
Platelets: 365 x10E3/uL (ref 150–450)
RBC: 5.85 x10E6/uL — ABNORMAL HIGH (ref 4.14–5.80)
RDW: 19.7 % — ABNORMAL HIGH (ref 11.6–15.4)
WBC: 8.8 x10E3/uL (ref 3.4–10.8)

## 2024-06-29 LAB — MICROALBUMIN / CREATININE URINE RATIO
Creatinine, Urine: 18.4 mg/dL
Microalb/Creat Ratio: <16 mg/g{creat} (ref 0–29)
Microalbumin, Urine: <3 ug/mL

## 2024-06-29 LAB — PSA: Prostate Specific Ag, Serum: 1 ng/mL (ref 0.0–4.0)

## 2024-07-09 ENCOUNTER — Ambulatory Visit: Payer: Self-pay | Admitting: Family Medicine

## 2024-07-09 DIAGNOSIS — D649 Anemia, unspecified: Secondary | ICD-10-CM

## 2024-07-12 ENCOUNTER — Other Ambulatory Visit: Payer: Self-pay | Admitting: Family Medicine

## 2024-07-12 DIAGNOSIS — K219 Gastro-esophageal reflux disease without esophagitis: Secondary | ICD-10-CM

## 2024-07-12 DIAGNOSIS — I10 Essential (primary) hypertension: Secondary | ICD-10-CM

## 2024-07-12 DIAGNOSIS — E119 Type 2 diabetes mellitus without complications: Secondary | ICD-10-CM

## 2024-10-11 ENCOUNTER — Other Ambulatory Visit: Payer: Self-pay

## 2024-10-11 ENCOUNTER — Ambulatory Visit: Admitting: Family Medicine

## 2024-10-11 ENCOUNTER — Encounter: Payer: Self-pay | Admitting: Family Medicine

## 2024-10-11 VITALS — BP 123/77 | HR 61 | Ht 73.0 in | Wt 259.0 lb

## 2024-10-11 DIAGNOSIS — I10 Essential (primary) hypertension: Secondary | ICD-10-CM

## 2024-10-11 DIAGNOSIS — E119 Type 2 diabetes mellitus without complications: Secondary | ICD-10-CM

## 2024-10-11 DIAGNOSIS — M5030 Other cervical disc degeneration, unspecified cervical region: Secondary | ICD-10-CM

## 2024-10-11 DIAGNOSIS — Z1211 Encounter for screening for malignant neoplasm of colon: Secondary | ICD-10-CM | POA: Diagnosis not present

## 2024-10-11 DIAGNOSIS — Z7984 Long term (current) use of oral hypoglycemic drugs: Secondary | ICD-10-CM

## 2024-10-11 LAB — POCT GLYCOSYLATED HEMOGLOBIN (HGB A1C): HbA1c, POC (controlled diabetic range): 6.1 % (ref 0.0–7.0)

## 2024-10-11 MED ORDER — TIRZEPATIDE 5 MG/0.5ML ~~LOC~~ SOAJ: 5.0000 mg | SUBCUTANEOUS | 1 refills | Status: AC

## 2024-10-11 MED ORDER — TIRZEPATIDE 2.5 MG/0.5ML ~~LOC~~ SOAJ: 2.5000 mg | SUBCUTANEOUS | 0 refills | Status: AC

## 2024-10-11 MED ORDER — CLOBETASOL PROPIONATE 0.05 % EX CREA: 1.0000 | TOPICAL_CREAM | Freq: Two times a day (BID) | CUTANEOUS | 1 refills | Status: AC

## 2024-10-11 NOTE — Assessment & Plan Note (Signed)
Blood pressure mains well-controlled.  He will continue amlodipine at current strength.

## 2024-10-11 NOTE — Assessment & Plan Note (Signed)
 Diabetes is well controlled at this time.  Discussed change to GLP-1 due to additional benefits compared to metformin .  Will start mounjaro  2.5mg  and titrate to 5mg  after 1 month.  Follow up in 3-4 months.

## 2024-10-11 NOTE — Assessment & Plan Note (Signed)
 Continue gabapentin PRN.

## 2024-10-11 NOTE — Progress Notes (Signed)
 " Cody Griffin - 59 y.o. male MRN [id number]  Date of birth: 1966-10-03  Subjective Chief Complaint  Patient presents with   Diabetes   Hypertension    HPI Cody Griffin is a 59 y.o. male here today for follow up.  He reports that he is doing well.   He remains on amlodipine  for management of HTN.  BP is well controlled.  He has not had chest pain, shortness of breath, palpitations, headache or vision changes.   Currently only on metfromin.  Has been working on dietary changes.  A1c today is 6.1%.  Weight is down about 10lbs.  Feels that this is residual from the 2.5mg  sample of mounjaro  as he was never able to get the next dose. .   Remains on gabapentin  for DDD of the lower back and neck.  This remains pretty effective for him.    ROS:  A comprehensive ROS was completed and negative except as noted per HPI  Allergies[1]  Past Medical History:  Diagnosis Date   Hypercholesterolemia     Past Surgical History:  Procedure Laterality Date   WISDOM TOOTH EXTRACTION      Social History   Socioeconomic History   Marital status: Married    Spouse name: Not on file   Number of children: Not on file   Years of education: Not on file   Highest education level: Not on file  Occupational History   Not on file  Tobacco Use   Smoking status: Never    Passive exposure: Never   Smokeless tobacco: Never  Vaping Use   Vaping status: Not on file  Substance and Sexual Activity   Alcohol use: Never   Drug use: Never   Sexual activity: Yes    Partners: Female  Other Topics Concern   Not on file  Social History Narrative   Not on file   Social Drivers of Health   Tobacco Use: Low Risk (06/28/2024)   Patient History    Smoking Tobacco Use: Never    Smokeless Tobacco Use: Never    Passive Exposure: Never  Financial Resource Strain: Low Risk (06/28/2024)   Overall Financial Resource Strain (CARDIA)    Difficulty of Paying Living Expenses: Not hard at all  Food Insecurity:  No Food Insecurity (06/28/2024)   Epic    Worried About Radiation Protection Practitioner of Food in the Last Year: Never true    Ran Out of Food in the Last Year: Never true  Transportation Needs: No Transportation Needs (06/28/2024)   Epic    Lack of Transportation (Medical): No    Lack of Transportation (Non-Medical): No  Physical Activity: Insufficiently Active (06/28/2024)   Exercise Vital Sign    Days of Exercise per Week: 5 days    Minutes of Exercise per Session: 20 min  Stress: No Stress Concern Present (06/28/2024)   [employer] of Occupational Health - Occupational Stress Questionnaire    Feeling of Stress: Only a little  Social Connections: Moderately Integrated (06/28/2024)   Social Connection and Isolation Panel    Frequency of Communication with Friends and Family: More than three times a week    Frequency of Social Gatherings with Friends and Family: Three times a week    Attends Religious Services: 1 to 4 times per year    Active Member of Clubs or Organizations: No    Attends Banker Meetings: Never    Marital Status: Married  Depression (PHQ2-9): Low Risk (10/11/2024)   Depression (PHQ2-9)  PHQ-2 Score: 2  Alcohol Screen: Low Risk (06/28/2024)   Alcohol Screen    Last Alcohol Screening Score (AUDIT): 0  Housing: Low Risk (06/28/2024)   Epic    Unable to Pay for Housing in the Last Year: No    Number of Times Moved in the Last Year: 0    Homeless in the Last Year: No  Utilities: Not At Risk (06/28/2024)   Epic    Threatened with loss of utilities: No  Health Literacy: Adequate Health Literacy (06/28/2024)   B1300 Health Literacy    Frequency of need for help with medical instructions: Never    Family History  Problem Relation Age of Onset   Diabetes Mother    Melanoma Father     Health Maintenance  Topic Date Due   Hepatitis B Vaccines 19-59 Average Risk (1 of 3 - 19+ 3-dose series) Never done   Fecal DNA (Cologuard)  07/31/2024   OPHTHALMOLOGY EXAM   08/28/2024   HIV Screening  12/28/2024 (Originally 09/08/1981)   COVID-19 Vaccine (1 - 2025-26 season) 06/03/2025 (Originally 06/04/2024)   HEMOGLOBIN A1C  04/10/2025   Diabetic kidney evaluation - eGFR measurement  06/28/2025   Diabetic kidney evaluation - Urine ACR  06/28/2025   FOOT EXAM  10/11/2025   DTaP/Tdap/Td (2 - Td or Tdap) 07/08/2027   Pneumococcal Vaccine: 50+ Years  Completed   Influenza Vaccine  Completed   Hepatitis C Screening  Completed   Zoster Vaccines- Shingrix   Completed   HPV VACCINES  Aged Out   Meningococcal B Vaccine  Aged Out     ----------------------------------------------------------------------------------------------------------------------------------------------------------------------------------------------------------------- Physical Exam BP 123/77 (BP Location: Left Arm, Patient Position: Sitting, Cuff Size: Large)   Pulse 61   Ht 6' 1 (1.854 m)   Wt 259 lb (117.5 kg)   SpO2 100%   BMI 34.17 kg/m   Physical Exam Constitutional:      Appearance: Normal appearance.  Eyes:     General: No scleral icterus. Cardiovascular:     Rate and Rhythm: Normal rate and regular rhythm.  Pulmonary:     Effort: Pulmonary effort is normal.     Breath sounds: Normal breath sounds.  Musculoskeletal:     Cervical back: Neck supple.  Neurological:     Mental Status: He is alert.  Psychiatric:        Mood and Affect: Mood normal.        Behavior: Behavior normal.     ------------------------------------------------------------------------------------------------------------------------------------------------------------------------------------------------------------------- Assessment and Plan  Type 2 diabetes mellitus (HCC) Diabetes is well controlled at this time.  Discussed change to GLP-1 due to additional benefits compared to metformin .  Will start mounjaro  2.5mg  and titrate to 5mg  after 1 month.  Follow up in 3-4 months.   Essential  hypertension Blood pressure mains well-controlled.  He will continue amlodipine  at current strength.  DDD (degenerative disc disease), cervical Continue gabapentin  PRN.    Meds ordered this encounter  Medications   tirzepatide  (MOUNJARO ) 2.5 MG/0.5ML Pen    Sig: Inject 2.5 mg into the skin once a week.    Dispense:  2 mL    Refill:  0   tirzepatide  (MOUNJARO ) 5 MG/0.5ML Pen    Sig: Inject 5 mg into the skin once a week. Start after completion of 2.5mg     Dispense:  6 mL    Refill:  1   clobetasol  cream (TEMOVATE ) 0.05 %    Sig: Apply 1 Application topically 2 (two) times daily.    Dispense:  45 g  Refill:  1    No follow-ups on file.        [1]  Allergies Allergen Reactions   Bee Pollen Anaphylaxis   Penicillins Hives   Salmon Oil [Fish Oil] Nausea And Vomiting   "

## 2025-02-14 ENCOUNTER — Ambulatory Visit: Admitting: Family Medicine
